# Patient Record
Sex: Female | Born: 1955 | Race: White | Hispanic: No | Smoking: Never smoker
Health system: Southern US, Community
[De-identification: ages and names within clinical notes are randomized; demographics above are authoritative.]

## PROBLEM LIST (undated history)

## (undated) DIAGNOSIS — K219 Gastro-esophageal reflux disease without esophagitis: Secondary | ICD-10-CM

## (undated) DIAGNOSIS — T753XXA Motion sickness, initial encounter: Secondary | ICD-10-CM

## (undated) DIAGNOSIS — C4490 Unspecified malignant neoplasm of skin, unspecified: Secondary | ICD-10-CM

## (undated) DIAGNOSIS — K08109 Complete loss of teeth, unspecified cause, unspecified class: Secondary | ICD-10-CM

## (undated) DIAGNOSIS — B589 Toxoplasmosis, unspecified: Secondary | ICD-10-CM

## (undated) DIAGNOSIS — I499 Cardiac arrhythmia, unspecified: Secondary | ICD-10-CM

## (undated) DIAGNOSIS — Z972 Presence of dental prosthetic device (complete) (partial): Secondary | ICD-10-CM

## (undated) DIAGNOSIS — I8390 Asymptomatic varicose veins of unspecified lower extremity: Secondary | ICD-10-CM

## (undated) DIAGNOSIS — I10 Essential (primary) hypertension: Secondary | ICD-10-CM

## (undated) DIAGNOSIS — R42 Dizziness and giddiness: Secondary | ICD-10-CM

## (undated) HISTORY — DX: Asymptomatic varicose veins of unspecified lower extremity: I83.90

## (undated) HISTORY — DX: Toxoplasmosis, unspecified: B58.9

## (undated) HISTORY — PX: EYE SURGERY: SHX253

## (undated) HISTORY — PX: CHOLECYSTECTOMY: SHX55

## (undated) HISTORY — PX: SKIN CANCER EXCISION: SHX779

## (undated) HISTORY — DX: Unspecified malignant neoplasm of skin, unspecified: C44.90

---

## 2004-06-03 ENCOUNTER — Ambulatory Visit: Payer: Self-pay | Admitting: Unknown Physician Specialty

## 2005-10-29 ENCOUNTER — Ambulatory Visit: Payer: Self-pay | Admitting: Family Medicine

## 2006-07-05 ENCOUNTER — Ambulatory Visit: Payer: Self-pay | Admitting: Obstetrics and Gynecology

## 2007-10-04 ENCOUNTER — Ambulatory Visit: Payer: Self-pay | Admitting: Ophthalmology

## 2008-01-19 HISTORY — PX: ABLATION SAPHENOUS VEIN W/ RFA: SUR11

## 2008-09-25 ENCOUNTER — Ambulatory Visit: Payer: Self-pay | Admitting: Family Medicine

## 2009-11-24 ENCOUNTER — Ambulatory Visit: Payer: Self-pay | Admitting: Unknown Physician Specialty

## 2009-11-26 ENCOUNTER — Ambulatory Visit: Payer: Self-pay | Admitting: Unknown Physician Specialty

## 2010-12-24 ENCOUNTER — Ambulatory Visit: Payer: Self-pay | Admitting: Obstetrics and Gynecology

## 2013-06-14 ENCOUNTER — Ambulatory Visit: Payer: Self-pay | Admitting: Nurse Practitioner

## 2014-06-27 ENCOUNTER — Other Ambulatory Visit: Payer: Self-pay

## 2014-06-28 ENCOUNTER — Other Ambulatory Visit: Payer: Self-pay

## 2014-06-28 DIAGNOSIS — Z1211 Encounter for screening for malignant neoplasm of colon: Secondary | ICD-10-CM

## 2014-06-28 NOTE — Progress Notes (Signed)
Pt screened via telephone call. Instructions/rx mailed.

## 2014-07-02 ENCOUNTER — Telehealth: Payer: Self-pay

## 2014-07-02 NOTE — Telephone Encounter (Signed)
Pt scheduled for a colonoscopy at [facility] on 08-26-14. Instructs/rx mailed.

## 2014-07-02 NOTE — Telephone Encounter (Signed)
-----   Message from Otilio Jefferson sent at 06/24/2014  9:25 AM EDT ----- Regarding: colon From: Tia Masker Sent: 06/06/2014  Phone Encounter: 06/07/2013  triage

## 2014-07-04 ENCOUNTER — Encounter: Payer: Self-pay | Admitting: *Deleted

## 2014-07-09 ENCOUNTER — Encounter: Payer: Self-pay | Admitting: General Surgery

## 2014-07-09 ENCOUNTER — Ambulatory Visit (INDEPENDENT_AMBULATORY_CARE_PROVIDER_SITE_OTHER): Payer: BLUE CROSS/BLUE SHIELD | Admitting: General Surgery

## 2014-07-09 VITALS — BP 100/64 | HR 80 | Resp 14 | Ht 64.0 in | Wt 206.0 lb

## 2014-07-09 DIAGNOSIS — I868 Varicose veins of other specified sites: Secondary | ICD-10-CM

## 2014-07-09 DIAGNOSIS — I8390 Asymptomatic varicose veins of unspecified lower extremity: Secondary | ICD-10-CM

## 2014-07-09 NOTE — Patient Instructions (Addendum)
Apply Zinc oxide or udder cream to lower legs. The patient is aware to call back for any questions or concerns. Wear compression hose- knee lengths are acceptable.   Follow up in 1 month to reassess, including venous duplex study.

## 2014-07-09 NOTE — Progress Notes (Signed)
Patient ID: Bridget Roach, female   DOB: [birth year], 59 y.o.   MRN: [id number]  Chief Complaint  Patient presents with  . Other    right leg nodule    HPI Bridget Roach is a 59 y.o. female.  Here today for evaluation of a right lower leg (near ankle) nodule with bleeding. She states she noticed some bleeding on Friday 07-05-14. She states there was no trauma that she recalls. It bled for about 5 minutes with pressure. This has never occurred before. History of right RF ablation 2010.   HPI  Past Medical History  Diagnosis Date  . Toxoplasmosis     eyes  . Skin cancer   . Varicose veins     Past Surgical History  Procedure Laterality Date  . Eye surgery    . Cholecystectomy    . Skin cancer excision    . Ablation saphenous vein w/ rfa Right 2010    Dr Jamal Collin    Family History  Problem Relation Age of Onset  . Stroke Father   . Cancer Mother     lung    Social History History  Substance Use Topics  . Smoking status: Never Smoker   . Smokeless tobacco: Never Used  . Alcohol Use: No    No Known Allergies  Current Outpatient Prescriptions  Medication Sig Dispense Refill  . atropine 1 % ophthalmic solution Place 1 drop into the right eye 2 (two) times daily.   0  . citalopram (CELEXA) 20 MG tablet Take 20 mg by mouth daily.   1  . levothyroxine (SYNTHROID, LEVOTHROID) 25 MCG tablet Take 25 mcg by mouth daily before breakfast.    . lisinopril-hydrochlorothiazide (PRINZIDE,ZESTORETIC) 20-25 MG per tablet Take 1 tablet by mouth daily.   1  . lovastatin (MEVACOR) 20 MG tablet Take 20 mg by mouth daily at 6 PM.   1  . metoprolol tartrate (LOPRESSOR) 25 MG tablet Take 25 mg by mouth 2 (two) times daily.   0  . omeprazole (PRILOSEC) 20 MG capsule Take 20 mg by mouth daily.   0  . Vitamin D, Ergocalciferol, (DRISDOL) 50000 UNITS CAPS capsule Take 50,000 Units by mouth every 7 (seven) days.   0   No current facility-administered medications for this visit.    Review  of Systems Review of Systems  Constitutional: Negative.   Respiratory: Negative.   Cardiovascular: Negative.     Blood pressure 100/64, pulse 80, resp. rate 14, height 5\' 4"  (1.626 m), weight 206 lb (93.441 kg).  Physical Exam Physical Exam  Constitutional: She is oriented to person, place, and time. She appears well-developed and well-nourished.  Cardiovascular:  Pulses:      Dorsalis pedis pulses are 2+ on the right side, and 2+ on the left side.       Posterior tibial pulses are 2+ on the right side, and 2+ on the left side.  Minimal swelling in bilateral lower legs. Some residual varicose veins bilaterally, not as prominent as they were at one time. Spider veins and some reticular veins above the ankle on both sides. The bleeding point appears sealed. Dry skin on bilateral lower legs.   Neurological: She is alert and oriented to person, place, and time.  Skin: Skin is warm and dry.    Data Reviewed Previous notes  Assessment    Varicose veins, previously treated with RF ablation on the right GSV. Had one episode spontaneous bleeder on the right leg above the ankle.  Plan    Apply Zinc oxide or udder cream to lower legs. The patient is aware to call back for any questions or concerns. Wear compression hose- knee lengths are acceptable.   Follow up in 1 month to reassess, including venous duplex study.      PCP:  Joselyn Glassman 07/09/2014, 6:18 PM

## 2014-08-07 ENCOUNTER — Encounter: Payer: Self-pay | Admitting: General Surgery

## 2014-08-07 ENCOUNTER — Ambulatory Visit (INDEPENDENT_AMBULATORY_CARE_PROVIDER_SITE_OTHER): Payer: BLUE CROSS/BLUE SHIELD | Admitting: General Surgery

## 2014-08-07 ENCOUNTER — Other Ambulatory Visit: Payer: BLUE CROSS/BLUE SHIELD

## 2014-08-07 VITALS — BP 104/64 | HR 68 | Resp 14 | Ht 64.0 in | Wt 206.0 lb

## 2014-08-07 DIAGNOSIS — I868 Varicose veins of other specified sites: Secondary | ICD-10-CM

## 2014-08-07 DIAGNOSIS — I8390 Asymptomatic varicose veins of unspecified lower extremity: Secondary | ICD-10-CM

## 2014-08-07 NOTE — Progress Notes (Signed)
She is here today for one month follow up varicose vein right lower ankle. No further episodes of bleeding. Her legs overall have "felt good". Only tylenol occasionally for the pain. No edema noted. She has a history of ABLATION right SAPHENOUS VEIN W/ RFA in 2010.  Duplex study-right GSV is sclerosed. Left GSV with markedly prolonged reflux. Both LSV area small and normal 2 perforator veins seen 7 and 10cm above right medial malleolus. No definite reflux noted.  Follow up in 3-4 months. Continue to wear compression hose.   PCP:  Sherrin Daisy

## 2014-08-07 NOTE — Patient Instructions (Addendum)
The patient is aware to call back for any questions or concerns. Continue to wear compression hose.

## 2014-08-16 ENCOUNTER — Encounter: Payer: Self-pay | Admitting: *Deleted

## 2014-08-22 NOTE — Discharge Instructions (Signed)

## 2014-08-26 ENCOUNTER — Encounter
Admission: RE | Disposition: A | Discharge status: Home / Self Care | Payer: Self-pay | Source: Ambulatory Visit | Attending: Gastroenterology

## 2014-08-26 ENCOUNTER — Ambulatory Visit
Admission: RE | Admit: 2014-08-26 | Discharge: 2014-08-26 | Disposition: A | Discharge status: Home / Self Care | Payer: BLUE CROSS/BLUE SHIELD | Source: Ambulatory Visit | Attending: Gastroenterology | Admitting: Gastroenterology

## 2014-08-26 ENCOUNTER — Ambulatory Visit: Payer: BLUE CROSS/BLUE SHIELD | Admitting: Anesthesiology

## 2014-08-26 DIAGNOSIS — Z85828 Personal history of other malignant neoplasm of skin: Secondary | ICD-10-CM | POA: Insufficient documentation

## 2014-08-26 DIAGNOSIS — I10 Essential (primary) hypertension: Secondary | ICD-10-CM | POA: Insufficient documentation

## 2014-08-26 DIAGNOSIS — K219 Gastro-esophageal reflux disease without esophagitis: Secondary | ICD-10-CM | POA: Diagnosis not present

## 2014-08-26 DIAGNOSIS — Z801 Family history of malignant neoplasm of trachea, bronchus and lung: Secondary | ICD-10-CM | POA: Insufficient documentation

## 2014-08-26 DIAGNOSIS — K640 First degree hemorrhoids: Secondary | ICD-10-CM | POA: Diagnosis not present

## 2014-08-26 DIAGNOSIS — Z79899 Other long term (current) drug therapy: Secondary | ICD-10-CM | POA: Diagnosis not present

## 2014-08-26 DIAGNOSIS — I499 Cardiac arrhythmia, unspecified: Secondary | ICD-10-CM | POA: Diagnosis not present

## 2014-08-26 DIAGNOSIS — Z9049 Acquired absence of other specified parts of digestive tract: Secondary | ICD-10-CM | POA: Diagnosis not present

## 2014-08-26 DIAGNOSIS — Z823 Family history of stroke: Secondary | ICD-10-CM | POA: Diagnosis not present

## 2014-08-26 DIAGNOSIS — Z1211 Encounter for screening for malignant neoplasm of colon: Secondary | ICD-10-CM | POA: Diagnosis not present

## 2014-08-26 DIAGNOSIS — I8390 Asymptomatic varicose veins of unspecified lower extremity: Secondary | ICD-10-CM | POA: Insufficient documentation

## 2014-08-26 HISTORY — DX: Presence of dental prosthetic device (complete) (partial): K08.109

## 2014-08-26 HISTORY — DX: Essential (primary) hypertension: I10

## 2014-08-26 HISTORY — DX: Cardiac arrhythmia, unspecified: I49.9

## 2014-08-26 HISTORY — DX: Motion sickness, initial encounter: T75.3XXA

## 2014-08-26 HISTORY — DX: Gastro-esophageal reflux disease without esophagitis: K21.9

## 2014-08-26 HISTORY — DX: Complete loss of teeth, unspecified cause, unspecified class: Z97.2

## 2014-08-26 HISTORY — PX: COLONOSCOPY WITH PROPOFOL: SHX5780

## 2014-08-26 HISTORY — DX: Dizziness and giddiness: R42

## 2014-08-26 SURGERY — COLONOSCOPY WITH PROPOFOL
Anesthesia: Monitor Anesthesia Care | Wound class: Contaminated

## 2014-08-26 MED ORDER — LIDOCAINE HCL (CARDIAC) 20 MG/ML IV SOLN
INTRAVENOUS | Status: DC | PRN
  Administered 2014-08-26: 50 mg via INTRAVENOUS

## 2014-08-26 MED ORDER — PROPOFOL 10 MG/ML IV BOLUS
INTRAVENOUS | Status: DC | PRN
  Administered 2014-08-26: 30 mg via INTRAVENOUS
  Administered 2014-08-26: 20 mg via INTRAVENOUS
  Administered 2014-08-26 (×2): 30 mg via INTRAVENOUS
  Administered 2014-08-26: 60 mg via INTRAVENOUS
  Administered 2014-08-26: 30 mg via INTRAVENOUS

## 2014-08-26 MED ORDER — ACETAMINOPHEN 160 MG/5ML PO SOLN: 325.0000 mg | ORAL | Status: DC | PRN

## 2014-08-26 MED ORDER — LACTATED RINGERS IV SOLN
INTRAVENOUS | Status: DC
  Administered 2014-08-26: 08:00:00 via INTRAVENOUS

## 2014-08-26 MED ORDER — STERILE WATER FOR IRRIGATION IR SOLN
Status: DC | PRN
  Administered 2014-08-26: 09:00:00

## 2014-08-26 MED ORDER — ACETAMINOPHEN 325 MG PO TABS
325.0000 mg | ORAL_TABLET | ORAL | Status: DC | PRN
Start: 2014-08-26 — End: 2014-08-26

## 2014-08-26 SURGICAL SUPPLY — 28 items

## 2014-08-26 NOTE — H&P (Signed)
[facility]  [address] [place], [place] [address] Phone: [phone] Fax : [phone]  Primary Care Physician:  Sherrin Daisy, MD Primary Gastroenterologist:  Dr. Allen Norris  Pre-Procedure History & Physical: HPI:  Bridget Roach is a 59 y.o. female is here for a screening colonoscopy.   Past Medical History  Diagnosis Date  . Toxoplasmosis     eyes  . Skin cancer   . Varicose veins   . Full dentures     upper and lower  . Hypertension   . Dysrhythmia   . GERD (gastroesophageal reflux disease)   . Vertigo     none in years  . Motion sickness     all moving vehicles    Past Surgical History  Procedure Laterality Date  . Eye surgery    . Cholecystectomy    . Skin cancer excision    . Ablation saphenous vein w/ rfa Right 2010    Dr Jamal Collin    Prior to Admission medications   Medication Sig Start Date End Date Taking? Authorizing Provider  citalopram (CELEXA) 20 MG tablet Take 20 mg by mouth daily.  07/01/14  Yes Historical Provider, MD  levothyroxine (SYNTHROID, LEVOTHROID) 25 MCG tablet Take 25 mcg by mouth daily before breakfast.   Yes Historical Provider, MD  lisinopril-hydrochlorothiazide (PRINZIDE,ZESTORETIC) 20-25 MG per tablet Take 1 tablet by mouth daily.  06/10/14  Yes Historical Provider, MD  lovastatin (MEVACOR) 20 MG tablet Take 20 mg by mouth daily at 6 PM.  07/01/14  Yes Historical Provider, MD  metoprolol tartrate (LOPRESSOR) 25 MG tablet Take 25 mg by mouth 2 (two) times daily.  06/05/14  Yes Historical Provider, MD  omeprazole (PRILOSEC) 20 MG capsule Take 20 mg by mouth daily.  06/24/14  Yes Historical Provider, MD  Vitamin D, Ergocalciferol, (DRISDOL) 50000 UNITS CAPS capsule Take 50,000 Units by mouth every 7 (seven) days.  07/01/14  Yes Historical Provider, MD  atropine 1 % ophthalmic solution Place 1 drop into the right eye 2 (two) times daily.  06/21/14   Historical Provider, MD    Allergies as of 06/28/2014  . (Not on File)     Family History  Problem Relation Age of Onset  . Stroke Father   . Cancer Mother     lung    History   Social History  . Marital Status: Married    Spouse Name: N/A  . Number of Children: N/A  . Years of Education: N/A   Occupational History  . Not on file.   Social History Main Topics  . Smoking status: Never Smoker   . Smokeless tobacco: Never Used  . Alcohol Use: No  . Drug Use: No  . Sexual Activity: Not on file   Other Topics Concern  . Not on file   Social History Narrative    Review of Systems: See HPI, otherwise negative ROS  Physical Exam: BP 129/82 mmHg  Pulse 79  Temp(Src) 97.5 F (36.4 C) (Temporal)  Resp 16  Ht 5\' 4"  (1.626 m)  Wt 201 lb (91.173 kg)  BMI 34.48 kg/m2  SpO2 97% General:   Alert,  pleasant and cooperative in NAD Head:  Normocephalic and atraumatic. Neck:  Supple; no masses or thyromegaly. Lungs:  Clear throughout to auscultation.    Heart:  Regular rate and rhythm. Abdomen:  Soft, nontender and nondistended. Normal bowel sounds, without guarding, and without rebound.   Neurologic:  Alert and  oriented x4;  grossly normal neurologically.  Impression/Plan: Bridget Roach is now here to undergo a screening colonoscopy.  Risks, benefits, and alternatives regarding colonoscopy have been reviewed with the patient.  Questions have been answered.  All parties agreeable.

## 2014-08-26 NOTE — Op Note (Signed)
[facility] Patient Name: Bridget Roach Procedure Date: 08/26/2014 8:27 AM MRN: [id number] Account #: [id number] Date of Birth: Sep 11, 1955 Admit Type: Outpatient Age: 59 Room: [facility] OR ROOM 01 Gender: Female Note Status: Finalized Procedure:         Colonoscopy Indications:       Screening for colorectal malignant neoplasm Providers:         Lucilla Lame, MD Referring MD:      Shirline Frees (Referring MD) Medicines:         Propofol per Anesthesia Complications:     No immediate complications. Procedure:         Pre-Anesthesia Assessment:                    - Prior to the procedure, a History and Physical was                     performed, and patient medications and allergies were                     reviewed. The patient's tolerance of previous anesthesia                     was also reviewed. The risks and benefits of the procedure                     and the sedation options and risks were discussed with the                     patient. All questions were answered, and informed consent                     was obtained. Prior Anticoagulants: The patient has taken                     no previous anticoagulant or antiplatelet agents. ASA                     Grade Assessment: II - A patient with mild systemic                     disease. After reviewing the risks and benefits, the                     patient was deemed in satisfactory condition to undergo                     the procedure.                    After obtaining informed consent, the colonoscope was                     passed under direct vision. Throughout the procedure, the                     patient's blood pressure, pulse, and oxygen saturations                     were monitored continuously. The Olympus CF-HQ190L                     Colonoscope (S#. [id number]) was introduced through the anus  and advanced to the the cecum, identified by appendiceal            orifice and ileocecal valve. The colonoscopy was performed                     without difficulty. The patient tolerated the procedure                     well. The quality of the bowel preparation was excellent. Findings:      The perianal and digital rectal examinations were normal.      Non-bleeding internal hemorrhoids were found during retroflexion. The       hemorrhoids were Grade I (internal hemorrhoids that do not prolapse). Impression:        - Non-bleeding internal hemorrhoids.                    - No specimens collected. Recommendation:    - Repeat colonoscopy in 10 years for screening unless any                     change in family history or lower GI problems. Procedure Code(s): --- Professional ---                    [phone], Colonoscopy, flexible; diagnostic, including                     collection of specimen(s) by brushing or washing, when                     performed (separate procedure) Diagnosis Code(s): --- Professional ---                    Z12.11, Encounter for screening for malignant neoplasm of                     colon                    K64.0, First degree hemorrhoids CPT copyright 2014 American Medical Association. All rights reserved. The codes documented in this report are preliminary and upon coder review may  be revised to meet current compliance requirements. Lucilla Lame, MD 08/26/2014 8:47:07 AM This report has been signed electronically. Number of Addenda: 0 Note Initiated On: 08/26/2014 8:27 AM Scope Withdrawal Time: 0 hours 6 minutes 13 seconds  Total Procedure Duration: 0 hours 9 minutes 42 seconds       [facility]

## 2014-08-26 NOTE — Transfer of Care (Signed)
Immediate Anesthesia Transfer of Care Note  Patient: Bridget Roach  Procedure(s) Performed: Procedure(s): COLONOSCOPY WITH PROPOFOL (N/A)  Patient Location: PACU  Anesthesia Type: MAC  Level of Consciousness: awake, alert  and patient cooperative  Airway and Oxygen Therapy: Patient Spontanous Breathing and Patient connected to supplemental oxygen  Post-op Assessment: Post-op Vital signs reviewed, Patient's Cardiovascular Status Stable, Respiratory Function Stable, Patent Airway and No signs of Nausea or vomiting  Post-op Vital Signs: Reviewed and stable  Complications: No apparent anesthesia complications

## 2014-08-26 NOTE — Anesthesia Postprocedure Evaluation (Signed)
  Anesthesia Post-op Note  Patient: Bridget Roach  Procedure(s) Performed: Procedure(s): COLONOSCOPY WITH PROPOFOL (N/A)  Anesthesia type:MAC  Patient location: PACU  Post pain: Pain level controlled  Post assessment: Post-op Vital signs reviewed, Patient's Cardiovascular Status Stable, Respiratory Function Stable, Patent Airway and No signs of Nausea or vomiting  Post vital signs: Reviewed and stable  Last Vitals:  Filed Vitals:   08/26/14 0849  BP:   Pulse:   Temp: 36.3 C  Resp:     Level of consciousness: awake, alert  and patient cooperative  Complications: No apparent anesthesia complications

## 2014-08-26 NOTE — Anesthesia Preprocedure Evaluation (Signed)
Anesthesia Evaluation  Patient identified by MRN, date of birth, ID band  Reviewed: Allergy & Precautions, H&P , NPO status , Patient's Chart, lab work & pertinent test results  Airway Mallampati: III  TM Distance: >3 FB Neck ROM: full    Dental  (+) Upper Dentures, Lower Dentures   Pulmonary    Pulmonary exam normal       Cardiovascular hypertension, Rhythm:regular Rate:Normal     Neuro/Psych    GI/Hepatic GERD-  ,  Endo/Other    Renal/GU      Musculoskeletal   Abdominal   Peds  Hematology   Anesthesia Other Findings   Reproductive/Obstetrics                             Anesthesia Physical Anesthesia Plan  ASA: II  Anesthesia Plan: MAC   Post-op Pain Management:    Induction:   Airway Management Planned:   Additional Equipment:   Intra-op Plan:   Post-operative Plan:   Informed Consent: I have reviewed the patients History and Physical, chart, labs and discussed the procedure including the risks, benefits and alternatives for the proposed anesthesia with the patient or authorized representative who has indicated his/her understanding and acceptance.     Plan Discussed with: CRNA  Anesthesia Plan Comments:         Anesthesia Quick Evaluation

## 2014-08-27 ENCOUNTER — Encounter: Payer: Self-pay | Admitting: Gastroenterology

## 2014-11-07 ENCOUNTER — Encounter: Payer: Self-pay | Admitting: General Surgery

## 2014-11-07 ENCOUNTER — Ambulatory Visit (INDEPENDENT_AMBULATORY_CARE_PROVIDER_SITE_OTHER): Payer: BLUE CROSS/BLUE SHIELD | Admitting: General Surgery

## 2014-11-07 VITALS — BP 118/72 | HR 74 | Resp 12 | Ht 64.0 in | Wt 212.0 lb

## 2014-11-07 DIAGNOSIS — I868 Varicose veins of other specified sites: Secondary | ICD-10-CM

## 2014-11-07 DIAGNOSIS — I8390 Asymptomatic varicose veins of unspecified lower extremity: Secondary | ICD-10-CM

## 2014-11-07 NOTE — Progress Notes (Signed)
She is here today for one month follow up varicose vein right lower ankle. No further episodes of bleeding. Her legs overall have "felt good". Only tylenol occasionally for the pain. No edema noted. She has a history of ABLATION right SAPHENOUS VEIN W/ RFA in 2010.  No edema, minimal varicose veins noted in b/l calves. Pedal pulses strong b/l. No stasis changes noted.   Patient requesting cosmetic treatment for spider veins b/l at a later date. Will set up appointment when ready.   Continue using compression hose and tylenol prn, follow up in one year unless having any issues sooner.

## 2014-11-07 NOTE — Patient Instructions (Signed)
The patient is aware to call back for any questions or concerns.  

## 2015-03-04 ENCOUNTER — Ambulatory Visit
Admission: EM | Admit: 2015-03-04 | Discharge: 2015-03-04 | Disposition: A | Discharge status: Home / Self Care | Payer: BLUE CROSS/BLUE SHIELD | Attending: Family Medicine | Admitting: Family Medicine

## 2015-03-04 DIAGNOSIS — S76311A Strain of muscle, fascia and tendon of the posterior muscle group at thigh level, right thigh, initial encounter: Secondary | ICD-10-CM

## 2015-03-04 DIAGNOSIS — S76011A Strain of muscle, fascia and tendon of right hip, initial encounter: Secondary | ICD-10-CM

## 2015-03-04 MED ORDER — NAPROXEN 500 MG PO TABS: 500.0000 mg | ORAL_TABLET | Freq: Two times a day (BID) | ORAL | Status: AC

## 2015-03-04 NOTE — Discharge Instructions (Signed)

## 2015-03-04 NOTE — ED Provider Notes (Signed)
CSN: [id number]     Arrival date & time 03/04/15  1627 History   First MD Initiated Contact with Patient 03/04/15 1710     Chief Complaint  Patient presents with  . Back Pain    Lower Back Pain  . Hip Pain    Right Hip Pain   (Consider location/radiation/quality/duration/timing/severity/associated sxs/prior Treatment) HPI   60 year old female who presents with right hip pain she indicates in her lower back radiating laterally and slightly inferiorly. About a week ago. She states that she was at the beach and stepped into a hole which jarred her and she began to have significant pain shortly afterwards. She states that she actually had to leave early to return home because the pain. Now she states that certain motions are particularly bothersome to her. She is not having any difficulty with her gait. She related while doing laundry yesterday she was unable to carry the basket of clothing , she tied a loop with a  belt through one of the hamper holes and dragged  It across the floor for comfortably. She denies any radiation of the pain into her leg or foot. She has no numbness or tingling and denies any incontinence.  Past Medical History  Diagnosis Date  . Toxoplasmosis     eyes  . Skin cancer   . Varicose veins   . Full dentures     upper and lower  . Hypertension   . Dysrhythmia   . GERD (gastroesophageal reflux disease)   . Vertigo     none in years  . Motion sickness     all moving vehicles   Past Surgical History  Procedure Laterality Date  . Eye surgery    . Cholecystectomy    . Skin cancer excision    . Ablation saphenous vein w/ rfa Right 2010    Dr Jamal Collin  . Colonoscopy with propofol N/A 08/26/2014    Procedure: COLONOSCOPY WITH PROPOFOL;  Surgeon: Lucilla Lame, MD;  Location: [place];  Service: Endoscopy;  Laterality: N/A;   Family History  Problem Relation Age of Onset  . Stroke Father   . Cancer Mother     lung   Social History  Substance Use Topics   . Smoking status: Never Smoker   . Smokeless tobacco: Never Used  . Alcohol Use: No   OB History    Gravida Para Term Preterm AB TAB SAB Ectopic Multiple Living   2 2              Obstetric Comments   1st Menstrual Cycle:  12  1st Pregnancy:  17     Review of Systems  Constitutional: Positive for activity change. Negative for fever, chills and fatigue.  Musculoskeletal: Positive for myalgias and back pain.  All other systems reviewed and are negative.   Allergies  Review of patient's allergies indicates no known allergies.  Home Medications   Prior to Admission medications   Medication Sig Start Date End Date Taking? Authorizing Provider  Calcium Carbonate (CALCIUM 600 PO) Take 1 tablet by mouth daily.   Yes Historical Provider, MD  citalopram (CELEXA) 20 MG tablet Take 20 mg by mouth daily.  07/01/14  Yes Historical Provider, MD  levothyroxine (SYNTHROID, LEVOTHROID) 25 MCG tablet Take 25 mcg by mouth daily before breakfast.   Yes Historical Provider, MD  lisinopril-hydrochlorothiazide (PRINZIDE,ZESTORETIC) 20-25 MG per tablet Take 1 tablet by mouth daily.  06/10/14  Yes Historical Provider, MD  lovastatin (MEVACOR) 20 MG tablet  Take 20 mg by mouth daily at 6 PM.  07/01/14  Yes Historical Provider, MD  metoprolol tartrate (LOPRESSOR) 25 MG tablet Take 25 mg by mouth 2 (two) times daily.  06/05/14  Yes Historical Provider, MD  omeprazole (PRILOSEC) 20 MG capsule Take 20 mg by mouth daily.  06/24/14  Yes Historical Provider, MD  Vitamin D, Ergocalciferol, (DRISDOL) 50000 UNITS CAPS capsule Take 50,000 Units by mouth every 7 (seven) days.  07/01/14  Yes Historical Provider, MD  atropine 1 % ophthalmic solution Place 1 drop into the right eye 2 (two) times daily.  06/21/14   Historical Provider, MD  naproxen (NAPROSYN) 500 MG tablet Take 1 tablet (500 mg total) by mouth 2 (two) times daily with a meal. 03/04/15   Lorin Picket, PA-C   Meds Ordered and Administered this Visit   Medications - No data to display  BP 116/57 mmHg  Pulse 74  Temp(Src) 98.4 F (36.9 C) (Oral)  Resp 17  Ht 5\' 4"  (1.626 m)  Wt 215 lb (97.523 kg)  BMI 36.89 kg/m2  SpO2 99% No data found.   Physical Exam  Constitutional: She is oriented to person, place, and time. She appears well-developed and well-nourished. No distress.  HENT:  Head: Normocephalic and atraumatic.  Eyes: Conjunctivae are normal. Pupils are equal, round, and reactive to light.  Neck: Normal range of motion. Neck supple.  Musculoskeletal: She exhibits tenderness. She exhibits no edema.  Examination of her back was performed in the presence of Heather, RN acting as a chaperone. The patient is a level pelvis in stance. For flexion and extension are full and not painful. Lateral flexion however is more painful particularly to the right. Resting rotation up acutely to the right is also painful. Able to toe and heel walk without difficulty. DTRs are 2+ and over 4 symmetrical laterally. Semination with the patient supine shows tenderness that reproduces her symptoms over the gluteus maximus and deeply into the gluteus medius which is the most painful. Hip Range of motion is full and comfortable.  Neurological: She is alert and oriented to person, place, and time.  Skin: Skin is warm. She is not diaphoretic.  Psychiatric: She has a normal mood and affect. Her behavior is normal. Judgment and thought content normal.  Nursing note and vitals reviewed.   ED Course  Procedures (including critical care time)  Labs Review Labs Reviewed - No data to display  Imaging Review No results found.   Visual Acuity Review  Right Eye Distance:   Left Eye Distance:   Bilateral Distance:    Right Eye Near:   Left Eye Near:    Bilateral Near:         MDM   1. Strain of gluteus medius of right lower extremity, initial encounter    Discharge Medication List as of 03/04/2015  5:57 PM    START taking these medications    Details  naproxen (NAPROSYN) 500 MG tablet Take 1 tablet (500 mg total) by mouth 2 (two) times daily with a meal., Starting 03/04/2015, Until Discontinued, Normal      Plan: 1. Diagnosis reviewed with patient 2. rx as per orders; risks, benefits, potential side effects reviewed with patient 3. Recommend supportive treatment with with symptom avoidance. Should consider heat alternating with ice as necessary. Increase activities as tolerated. She continues to have problems she should be seen by her primary care physician for further evaluation 4. F/u prn if symptoms worsen or don't improve  Lorin Picket, PA-C 03/04/15 1814

## 2015-03-04 NOTE — ED Notes (Signed)
Patient c/o right hip pain and lower back pain which started 1 week ago.  She states that she was at the beach last week and stepped into a gully and jarred her back.

## 2015-03-11 ENCOUNTER — Ambulatory Visit (INDEPENDENT_AMBULATORY_CARE_PROVIDER_SITE_OTHER): Payer: BLUE CROSS/BLUE SHIELD

## 2015-03-11 ENCOUNTER — Ambulatory Visit
Admission: EM | Admit: 2015-03-11 | Discharge: 2015-03-11 | Disposition: A | Discharge status: Home / Self Care | Payer: BLUE CROSS/BLUE SHIELD | Attending: Family Medicine | Admitting: Family Medicine

## 2015-03-11 DIAGNOSIS — S93402A Sprain of unspecified ligament of left ankle, initial encounter: Secondary | ICD-10-CM

## 2015-03-11 NOTE — Discharge Instructions (Signed)
Ankle Sprain °An ankle sprain is an injury to the strong, fibrous tissues (ligaments) that hold your ankle bones together.  °HOME CARE  °· Put ice on your ankle for 1-2 days or as told by your doctor. °· Put ice in a plastic bag. °· Place a towel between your skin and the bag. °· Leave the ice on for 15-20 minutes at a time, every 2 hours while you are awake. °· Only take medicine as told by your doctor. °· Raise (elevate) your injured ankle above the level of your heart as much as possible for 2-3 days. °· Use crutches if your doctor tells you to. Slowly put your own weight on the affected ankle. Use the crutches until you can walk without pain. °· If you have a plaster splint: °· Do not rest it on anything harder than a pillow for 24 hours. °· Do not put weight on it. °· Do not get it wet. °· Take it off to shower or bathe. °· If given, use an elastic wrap or support stocking for support. Take the wrap off if your toes lose feeling (numb), tingle, or turn cold or blue. °· If you have an air splint: °· Add or let out air to make it comfortable. °· Take it off at night and to shower and bathe. °· Wiggle your toes and move your ankle up and down often while you are wearing it. °GET HELP IF: °· You have rapidly increasing bruising or puffiness (swelling). °· Your toes feel very cold. °· You lose feeling in your foot. °· Your medicine does not help your pain. °GET HELP RIGHT AWAY IF:  °· Your toes lose feeling (numb) or turn blue. °· You have severe pain that is increasing. °MAKE SURE YOU:  °· Understand these instructions. °· Will watch your condition. °· Will get help right away if you are not doing well or get worse. °  °This information is not intended to replace advice given to you by your health care provider. Make sure you discuss any questions you have with your health care provider. °  °Document Released: 06/23/2007 Document Revised: 01/25/2014 Document Reviewed: 07/19/2011 °Elsevier Interactive Patient  Education ©2016 [employer] ° °Cryotherapy °Cryotherapy is when you put ice on your injury. Ice helps lessen pain and puffiness (swelling) after an injury. Ice works the best when you start using it in the first 24 to 48 hours after an injury. °HOME CARE °· Put a dry or damp towel between the ice pack and your skin. °· You may press gently on the ice pack. °· Leave the ice on for no more than 10 to 20 minutes at a time. °· Check your skin after 5 minutes to make sure your skin is okay. °· Rest at least 20 minutes between ice pack uses. °· Stop using ice when your skin loses feeling (numbness). °· Do not use ice on someone who cannot tell you when it hurts. This includes small children and people with memory problems (dementia). °GET HELP RIGHT AWAY IF: °· You have white spots on your skin. °· Your skin turns blue or pale. °· Your skin feels waxy or hard. °· Your puffiness gets worse. °MAKE SURE YOU:  °· Understand these instructions. °· Will watch your condition. °· Will get help right away if you are not doing well or get worse. °  °This information is not intended to replace advice given to you by your health care provider. Make sure you discuss any   questions you have with your health care provider.   Document Released: 06/23/2007 Document Revised: 03/29/2011 Document Reviewed: 08/27/2010 Elsevier Interactive Patient Education 2016 Else[employer] Inc[employer]  Stirrup Ankle Brace Stirrup ankle braces give support and help stabilize the ankle joint. They are rigid pieces of plastic or fiberglass that go up both sides of the lower leg with the bottom of the stirrup fitting comfortably under the bottom of the instep of the foot. It can be held on with Velcro straps or an elastic wrap. Stirrup ankle braces are used to support the ankle following mild or moderate sprains or strains, or fractures after cast removal.  They can be easily removed or adjusted if there is swelling. The rigid brace shells are designed to fit the  ankle comfortably and provide the needed medial/lateral stabilization. This brace can be easily worn with most athletic shoes. The brace liner is usually made of a soft, comfortable gel-like material. This gel fits the ankle well without causing uncomfortable pressure points.  IMPORTANCE OF ANKLE BRACES:  The use of ankle bracing is effective in the prevention of ankle sprains.  In athletes, the use of ankle bracing will offer protection and prevent further sprains.  Research shows that a complete rehabilitation program needs to be included with external bracing. This includes range of motion and ankle strengthening exercises. Your caregivers will instruct you in this. If you were given the brace today for a new injury, use the following home care instructions as a guide. HOME CARE INSTRUCTIONS   Apply ice to the sore area for 15-20 minutes, 03-04 times per day while awake for the first 2 days. Put the ice in a plastic bag and place a towel between the bag of ice and your skin. Never place the ice pack directly on your skin. Be especially careful using ice on an elbow or knee or other bony area, such as your ankle, because icing for too long may damage the nerves which are close to the surface.  Keep your leg elevated when possible to lessen swelling.  Wear your splint until you are seen for a follow-up examination. Do not put weight on it. Do not get it wet. You may take it off to take a shower or bath.  For Activity: Use crutches with non-weight bearing for 1 week. Then, you may walk on your ankle as instructed. Start gradually with weight bearing on the affected ankle.  Continue to use crutches or a cane until you can stand on your ankle without causing pain.  Wiggle your toes in the splint several times per day if you are able.  The splint is too tight if you have numbness, tingling, or if your foot becomes cold and blue. Adjust the straps or elastic bandage to make it comfortable.  Only  take over-the-counter or prescription medicines for pain, discomfort, or fever as directed by your caregiver. SEEK IMMEDIATE MEDICAL CARE IF:   You have increased bruising, swelling or pain.  Your toes are blue or cold and loosening the brace or wrap does not help.  Your pain is not relieved with medicine. MAKE SURE YOU:   Understand these instructions.  Will watch your condition.  Will get help right away if you are not doing well or get worse.   This information is not intended to replace advice given to you by your health care provider. Make sure you discuss any questions you have with your health care provider.   Document Released: 11/05/2003 Document Revised: 03/29/2011  Document Reviewed: 08/06/2014 Elsevier Interactive Patient Education [employer].

## 2015-03-11 NOTE — ED Notes (Addendum)
Patient states that she turned her left ankle and fell off the curb of the sidewalk this past Sunday.  She states that her left foot is where she is having pain however.  Patient was here last week as well.

## 2015-03-11 NOTE — ED Provider Notes (Signed)
CSN: [id number]     Arrival date & time 03/11/15  1423 History   First MD Initiated Contact with Patient 03/11/15 1617    Nurses notes were reviewed. Chief Complaint  Patient presents with  . Foot Pain    Left Foot Pain    Patient report Sunday twisting her left ankle injuring it. States that the ankle still hurts and that her friends and families concerns she came in to be seen and evaluated. Patient multiple issues multiple medical problems including hypertension skin cancers or surgery cholecystectomy. Father had a stroke mother had cancer of the lung. She states she never smoked. (Consider location/radiation/quality/duration/timing/severity/associated sxs/prior Treatment) HPI  Past Medical History  Diagnosis Date  . Toxoplasmosis     eyes  . Skin cancer   . Varicose veins   . Full dentures     upper and lower  . Hypertension   . Dysrhythmia   . GERD (gastroesophageal reflux disease)   . Vertigo     none in years  . Motion sickness     all moving vehicles   Past Surgical History  Procedure Laterality Date  . Eye surgery    . Cholecystectomy    . Skin cancer excision    . Ablation saphenous vein w/ rfa Right 2010    Dr Jamal Collin  . Colonoscopy with propofol N/A 08/26/2014    Procedure: COLONOSCOPY WITH PROPOFOL;  Surgeon: Lucilla Lame, MD;  Location: [facility];  Service: Endoscopy;  Laterality: N/A;   Family History  Problem Relation Age of Onset  . Stroke Father   . Cancer Mother     lung   Social History  Substance Use Topics  . Smoking status: Never Smoker   . Smokeless tobacco: Never Used  . Alcohol Use: No   OB History    Gravida Para Term Preterm AB TAB SAB Ectopic Multiple Living   2 2              Obstetric Comments   1st Menstrual Cycle:  12  1st Pregnancy:  17     Review of Systems  All other systems reviewed and are negative.   Allergies  Review of patient's allergies indicates no known allergies.  Home Medications   Prior to  Admission medications   Medication Sig Start Date End Date Taking? Authorizing Provider  Calcium Carbonate (CALCIUM 600 PO) Take 1 tablet by mouth daily.   Yes Historical Provider, MD  citalopram (CELEXA) 20 MG tablet Take 20 mg by mouth daily.  07/01/14  Yes Historical Provider, MD  levothyroxine (SYNTHROID, LEVOTHROID) 25 MCG tablet Take 25 mcg by mouth daily before breakfast.   Yes Historical Provider, MD  lisinopril-hydrochlorothiazide (PRINZIDE,ZESTORETIC) 20-25 MG per tablet Take 1 tablet by mouth daily.  06/10/14  Yes Historical Provider, MD  lovastatin (MEVACOR) 20 MG tablet Take 20 mg by mouth daily at 6 PM.  07/01/14  Yes Historical Provider, MD  metoprolol tartrate (LOPRESSOR) 25 MG tablet Take 25 mg by mouth 2 (two) times daily.  06/05/14  Yes Historical Provider, MD  naproxen (NAPROSYN) 500 MG tablet Take 1 tablet (500 mg total) by mouth 2 (two) times daily with a meal. 03/04/15  Yes Lorin Picket, PA-C  omeprazole (PRILOSEC) 20 MG capsule Take 20 mg by mouth daily.  06/24/14  Yes Historical Provider, MD  Vitamin D, Ergocalciferol, (DRISDOL) 50000 UNITS CAPS capsule Take 50,000 Units by mouth every 7 (seven) days.  07/01/14  Yes Historical Provider, MD  atropine 1 %  ophthalmic solution Place 1 drop into the right eye 2 (two) times daily.  06/21/14   Historical Provider, MD   Meds Ordered and Administered this Visit  Medications - No data to display  BP 111/58 mmHg  Pulse 64  Temp(Src) 98 F (36.7 C) (Oral)  Resp 16  Ht 5\' 4"  (1.626 m)  Wt 214 lb (97.07 kg)  BMI 36.72 kg/m2  SpO2 100% No data found.   Physical Exam  Constitutional: She is oriented to person, place, and time. She appears well-developed and well-nourished.  HENT:  Head: Normocephalic and atraumatic.  Musculoskeletal: She exhibits tenderness.       Left ankle: Tenderness. Lateral malleolus tenderness found.       Feet:  Patient prone had an inversion injury with tenderness over the lateral malleolus of left  ankle  Neurological: She is alert and oriented to person, place, and time.  Skin: Skin is warm. No rash noted. There is erythema. No pallor.  Psychiatric: She has a normal mood and affect.    ED Course  Procedures (including critical care time)  Labs Review Labs Reviewed - No data to display  Imaging Review Dg Ankle Complete Left  03/11/2015  CLINICAL DATA:  Twisted ankle walking on sidewalk Sunday, lateral pain EXAM: LEFT ANKLE COMPLETE - 3+ VIEW COMPARISON:  None. FINDINGS: Three views of the left ankle submitted. No acute fracture or subluxation. Diffuse mild soft tissue swelling around the ankle. Tiny plantar and posterior spur of calcaneus. Ankle mortise is preserved. IMPRESSION: No acute fracture or subluxation. Diffuse mild soft tissue swelling. Small plantar and posterior spur of calcaneus. Electronically Signed   By: Lahoma Crocker M.D.   On: 03/11/2015 16:35   Dg Foot Complete Left  03/11/2015  CLINICAL DATA:  Twisted ankle, pain laterally EXAM: LEFT FOOT - COMPLETE 3+ VIEW COMPARISON:  None. FINDINGS: Tarsal-metatarsal alignment is normal. No fracture is seen. No erosion is noted. IMPRESSION: Negative. Electronically Signed   By: Ivar Drape M.D.   On: 03/11/2015 16:26     Visual Acuity Review  Right Eye Distance:   Left Eye Distance:   Bilateral Distance:    Right Eye Near:   Left Eye Near:    Bilateral Near:         MDM   1. Left ankle sprain, initial encounter    We'll place patient in a left ankle splint return to follow-up PCP as needed.   Note: This dictation was prepared with Dragon dictation along with smaller phrase technology. Any transcriptional errors that result from this process are unintentional.    Frederich Cha, MD 03/11/15 1735

## 2015-07-15 ENCOUNTER — Ambulatory Visit
Admission: EM | Admit: 2015-07-15 | Discharge: 2015-07-15 | Disposition: A | Discharge status: Home / Self Care | Payer: BLUE CROSS/BLUE SHIELD | Attending: Family Medicine | Admitting: Family Medicine

## 2015-07-15 ENCOUNTER — Encounter: Payer: Self-pay | Admitting: Emergency Medicine

## 2015-07-15 DIAGNOSIS — S81812A Laceration without foreign body, left lower leg, initial encounter: Secondary | ICD-10-CM | POA: Diagnosis not present

## 2015-07-15 NOTE — ED Provider Notes (Signed)
CSN: [id number]     Arrival date & time 07/15/15  1522 History   First MD Initiated Contact with Patient 07/15/15 1551     Chief Complaint  Patient presents with  . Laceration   (Consider location/radiation/quality/duration/timing/severity/associated sxs/prior Treatment) HPI  This 60 year old female presents with a small nick that occurred when she was shaving her legs in the shower today. She states that the bleeding was severe at first and "squirting". She is using a brand new razor. She is current on her tetanus toxoid. Bleeding was controlled by nursing staff here at the clinic.      Past Medical History  Diagnosis Date  . Toxoplasmosis     eyes  . Skin cancer   . Varicose veins   . Full dentures     upper and lower  . Hypertension   . Dysrhythmia   . GERD (gastroesophageal reflux disease)   . Vertigo     none in years  . Motion sickness     all moving vehicles   Past Surgical History  Procedure Laterality Date  . Eye surgery    . Cholecystectomy    . Skin cancer excision    . Ablation saphenous vein w/ rfa Right 2010    Dr Jamal Collin  . Colonoscopy with propofol N/A 08/26/2014    Procedure: COLONOSCOPY WITH PROPOFOL;  Surgeon: Lucilla Lame, MD;  Location: [facility];  Service: Endoscopy;  Laterality: N/A;   Family History  Problem Relation Age of Onset  . Stroke Father   . Cancer Mother     lung   Social History  Substance Use Topics  . Smoking status: Never Smoker   . Smokeless tobacco: Never Used  . Alcohol Use: No   OB History    Gravida Para Term Preterm AB TAB SAB Ectopic Multiple Living   2 2              Obstetric Comments   1st Menstrual Cycle:  12  1st Pregnancy:  17     Review of Systems  Constitutional: Negative for fever, chills, activity change and fatigue.  Skin: Positive for wound.  All other systems reviewed and are negative.   Allergies  Review of patient's allergies indicates no known allergies.  Home Medications    Prior to Admission medications   Medication Sig Start Date End Date Taking? Authorizing Provider  atropine 1 % ophthalmic solution Place 1 drop into the right eye 2 (two) times daily.  06/21/14   Historical Provider, MD  Calcium Carbonate (CALCIUM 600 PO) Take 1 tablet by mouth daily.    Historical Provider, MD  citalopram (CELEXA) 20 MG tablet Take 20 mg by mouth daily.  07/01/14   Historical Provider, MD  levothyroxine (SYNTHROID, LEVOTHROID) 25 MCG tablet Take 25 mcg by mouth daily before breakfast.    Historical Provider, MD  lisinopril-hydrochlorothiazide (PRINZIDE,ZESTORETIC) 20-25 MG per tablet Take 1 tablet by mouth daily.  06/10/14   Historical Provider, MD  lovastatin (MEVACOR) 20 MG tablet Take 20 mg by mouth daily at 6 PM.  07/01/14   Historical Provider, MD  metoprolol tartrate (LOPRESSOR) 25 MG tablet Take 25 mg by mouth 2 (two) times daily.  06/05/14   Historical Provider, MD  naproxen (NAPROSYN) 500 MG tablet Take 1 tablet (500 mg total) by mouth 2 (two) times daily with a meal. 03/04/15   Lorin Picket, PA-C  omeprazole (PRILOSEC) 20 MG capsule Take 20 mg by mouth daily.  06/24/14   Historical  Provider, MD  Vitamin D, Ergocalciferol, (DRISDOL) 50000 UNITS CAPS capsule Take 50,000 Units by mouth every 7 (seven) days.  07/01/14   Historical Provider, MD   Meds Ordered and Administered this Visit  Medications - No data to display  BP 131/55 mmHg  Pulse 73  Temp(Src) 98.1 F (36.7 C) (Oral)  Resp 18  SpO2 96% No data found.   Physical Exam  Constitutional: She is oriented to person, place, and time. She appears well-developed and well-nourished. No distress.  HENT:  Head: Normocephalic and atraumatic.  Eyes: Conjunctivae are normal. Pupils are equal, round, and reactive to light.  Neck: Normal range of motion. Neck supple.  Musculoskeletal: Normal range of motion. She exhibits tenderness. She exhibits no edema.  Neurological: She is alert and oriented to person, place, and  time.  Skin: Skin is warm and dry. She is not diaphoretic.  Examination of the distal left leg anteriorly shows a small pinpoint laceration over a prominent transverse vein is very superficial. Bleeding is controlled. There is some mild tenderness around the area.  Psychiatric: She has a normal mood and affect. Her behavior is normal. Judgment and thought content normal.  Nursing note and vitals reviewed.   ED Course  Procedures (including critical care time)  Labs Review Labs Reviewed - No data to display  Imaging Review No results found.   Visual Acuity Review  Right Eye Distance:   Left Eye Distance:   Bilateral Distance:    Right Eye Near:   Left Eye Near:    Bilateral Near:     Procedure; discussed with the patient the need for patient for signs and symptoms of infection. I have decided to apply Dermabond glue to the area for added protection until healing fully takes place. There is no need for suturing. This is performed without complication. And tolerated the procedure well.    MDM   1. Laceration of left leg, initial encounter    New Prescriptions   No medications on file  Plan: 1. Test/x-ray results and diagnosis reviewed with patient 2. rx as per orders; risks, benefits, potential side effects reviewed with patient 3. Recommend supportive treatment with Observation for signs of possible infection instructions were given to the patient as well as printed material. 4. F/u prn if symptoms worsen or don't improve     Lorin Picket, PA-C 07/15/15 1705

## 2015-07-15 NOTE — Discharge Instructions (Signed)
Laceration Care, Adult A laceration is a cut that goes through all of the layers of the skin and into the tissue that is right under the skin. Some lacerations heal on their own. Others need to be closed with stitches (sutures), staples, skin adhesive strips, or skin glue. Proper laceration care minimizes the risk of infection and helps the laceration to heal better. HOW TO CARE FOR YOUR LACERATION If sutures or staples were used:  Keep the wound clean and dry.  If you were given a bandage (dressing), you should change it at least one time per day or as told by your health care provider. You should also change it if it becomes wet or dirty.  Keep the wound completely dry for the first 24 hours or as told by your health care provider. After that time, you may shower or bathe. However, make sure that the wound is not soaked in water until after the sutures or staples have been removed.  Clean the wound one time each day or as told by your health care provider:  Wash the wound with soap and water.  Rinse the wound with water to remove all soap.  Pat the wound dry with a clean towel. Do not rub the wound.  After cleaning the wound, apply a thin layer of antibiotic ointmentas told by your health care provider. This will help to prevent infection and keep the dressing from sticking to the wound.  Have the sutures or staples removed as told by your health care provider. If skin adhesive strips were used:  Keep the wound clean and dry.  If you were given a bandage (dressing), you should change it at least one time per day or as told by your health care provider. You should also change it if it becomes dirty or wet.  Do not get the skin adhesive strips wet. You may shower or bathe, but be careful to keep the wound dry.  If the wound gets wet, pat it dry with a clean towel. Do not rub the wound.  Skin adhesive strips fall off on their own. You may trim the strips as the wound heals. Do not  remove skin adhesive strips that are still stuck to the wound. They will fall off in time. If skin glue was used:  Try to keep the wound dry, but you may briefly wet it in the shower or bath. Do not soak the wound in water, such as by swimming.  After you have showered or bathed, gently pat the wound dry with a clean towel. Do not rub the wound.  Do not do any activities that will make you sweat heavily until the skin glue has fallen off on its own.  Do not apply liquid, cream, or ointment medicine to the wound while the skin glue is in place. Using those may loosen the film before the wound has healed.  If you were given a bandage (dressing), you should change it at least one time per day or as told by your health care provider. You should also change it if it becomes dirty or wet.  If a dressing is placed over the wound, be careful not to apply tape directly over the skin glue. Doing that may cause the glue to be pulled off before the wound has healed.  Do not pick at the glue. The skin glue usually remains in place for 5-10 days, then it falls off of the skin. General Instructions  Take over-the-counter and prescription  medicines only as told by your health care provider.  If you were prescribed an antibiotic medicine or ointment, take or apply it as told by your doctor. Do not stop using it even if your condition improves.  To help prevent scarring, make sure to cover your wound with sunscreen whenever you are outside after stitches are removed, after adhesive strips are removed, or when glue remains in place and the wound is healed. Make sure to wear a sunscreen of at least 30 SPF.  Do not scratch or pick at the wound.  Keep all follow-up visits as told by your health care provider. This is important.  Check your wound every day for signs of infection. Watch for:  Redness, swelling, or pain.  Fluid, blood, or pus.  Raise (elevate) the injured area above the level of your heart  while you are sitting or lying down, if possible. SEEK MEDICAL CARE IF:  You received a tetanus shot and you have swelling, severe pain, redness, or bleeding at the injection site.  You have a fever.  A wound that was closed breaks open.  You notice a bad smell coming from your wound or your dressing.  You notice something coming out of the wound, such as wood or glass.  Your pain is not controlled with medicine.  You have increased redness, swelling, or pain at the site of your wound.  You have fluid, blood, or pus coming from your wound.  You notice a change in the color of your skin near your wound.  You need to change the dressing frequently due to fluid, blood, or pus draining from the wound.  You develop a new rash.  You develop numbness around the wound. SEEK IMMEDIATE MEDICAL CARE IF:  You develop severe swelling around the wound.  Your pain suddenly increases and is severe.  You develop painful lumps near the wound or on skin that is anywhere on your body.  You have a red streak going away from your wound.  The wound is on your hand or foot and you cannot properly move a finger or toe.  The wound is on your hand or foot and you notice that your fingers or toes look pale or bluish.   This information is not intended to replace advice given to you by your health care provider. Make sure you discuss any questions you have with your health care provider.   Document Released: 01/04/2005 Document Revised: 05/21/2014 Document Reviewed: 12/31/2013 Elsevier Interactive Patient Education 2016 [employer]  Nonsutured Laceration Care A laceration is a cut that goes through all layers of the skin and extends into the tissue that is right under the skin. This type of cut is usually stitched up (sutured) or closed with tape (adhesive strips) or skin glue shortly after the injury happens. However, if the wound is dirty or if several hours pass before medical treatment is  provided, it is likely that germs (bacteria) will enter the wound. Closing a laceration after bacteria have entered it increases the risk of infection. In these cases, your health care provider may leave the laceration open (nonsutured) and cover it with a bandage. This type of treatment helps prevent infection and allows the wound to heal from the deepest layer of tissue damage up to the surface. An open fracture is a type of injury that may involve nonsutured lacerations. An open fracture is a break in a bone that happens along with one or more lacerations through the skin that  is near the fracture site. HOW TO CARE FOR YOUR NONSUTURED LACERATION  Take or apply over-the-counter and prescription medicines only as told by your health care provider.  If you were prescribed an antibiotic medicine, take or apply it as told by your health care provider. Do not stop using the antibiotic even if your condition improves.  Clean the wound one time each day or as told by your health care provider.  Wash the wound with mild soap and water.  Rinse the wound with water to remove all soap.  Pat your wound dry with a clean towel. Do not rub the wound.  Do not inject anything into the wound unless your health care provider told you to.  Change any bandages (dressings) as told by your health care provider. This includes changing the dressing if it gets wet, dirty, or starts to smell bad.  Keep the dressing dry until your health care provider says it can be removed. Do not take baths, swim, or do anything that puts your wound underwater until your health care provider approves.  Raise (elevate) the injured area above the level of your heart while you are sitting or lying down, if possible.  Do not scratch or pick at the wound.  Check your wound every day for signs of infection. Watch for:  Redness, swelling, or pain.  Fluid, blood, or pus.  Keep all follow-up visits as told by your health care  provider. This is important. SEEK MEDICAL CARE IF:  You received a tetanus and shot and you have swelling, severe pain, redness, or bleeding at the injection site.   You have a fever.  Your pain is not controlled with medicine.  You have increased redness, swelling, or pain at the site of your wound.  You have fluid, blood, or pus coming from your wound.  You notice a bad smell coming from your wound or your dressing.  You notice something coming out of the wound, such as wood or glass.  You notice a change in the color of your skin near your wound.  You develop a new rash.  You need to change the dressing frequently due to fluid, blood, or pus draining from the wound.  You develop numbness around your wound. SEEK IMMEDIATE MEDICAL CARE IF:  Your pain suddenly increases and is severe.  You develop severe swelling around the wound.  The wound is on your hand or foot and you cannot properly move a finger or toe.  The wound is on your hand or foot and you notice that your fingers or toes look pale or bluish.  You have a red streak going away from your wound.   This information is not intended to replace advice given to you by your health care provider. Make sure you discuss any questions you have with your health care provider.   Document Released: 12/02/2005 Document Revised: 05/21/2014 Document Reviewed: 12/31/2013 Elsevier Interactive Patient Education [employer].

## 2015-07-15 NOTE — ED Notes (Addendum)
Pt presents with left lower ext laceration, started bleeding in the shower while pt was shaving. Pt states blood was "squirting". Pt noted to have pressure dressing in place on arrival. Pt is not taking any blood thinners at this time.

## 2015-09-03 ENCOUNTER — Encounter: Payer: Self-pay | Admitting: *Deleted

## 2015-11-04 ENCOUNTER — Encounter: Payer: Self-pay | Admitting: *Deleted

## 2015-11-10 ENCOUNTER — Encounter: Payer: Self-pay | Admitting: General Surgery

## 2015-11-10 ENCOUNTER — Ambulatory Visit (INDEPENDENT_AMBULATORY_CARE_PROVIDER_SITE_OTHER): Payer: BLUE CROSS/BLUE SHIELD | Admitting: General Surgery

## 2015-11-10 VITALS — BP 126/72 | HR 76 | Resp 12 | Ht 64.0 in | Wt 213.0 lb

## 2015-11-10 DIAGNOSIS — I8393 Asymptomatic varicose veins of bilateral lower extremities: Secondary | ICD-10-CM

## 2015-11-10 NOTE — Progress Notes (Signed)
Patient ID: Bridget Roach, female   DOB: 1955/04/21, 60 y.o.   MRN: [id number]  Chief Complaint  Patient presents with  . Follow-up    varicose vein    HPI Bridget Roach is a 60 y.o. female here today for a one year varicose vein follow up. Patient states she is doing well, no new changes. Patient is still wearing compression stockings daily. No leg pain, shortness of breath or chest pains. I have reviewed the history of present illness with the patient.  HPI  Past Medical History:  Diagnosis Date  . Dysrhythmia   . Full dentures    upper and lower  . GERD (gastroesophageal reflux disease)   . Hypertension   . Motion sickness    all moving vehicles  . Skin cancer   . Toxoplasmosis    eyes  . Varicose veins   . Vertigo    none in years    Past Surgical History:  Procedure Laterality Date  . ABLATION SAPHENOUS VEIN W/ RFA Right 2010   Dr Jamal Collin  . CHOLECYSTECTOMY    . COLONOSCOPY WITH PROPOFOL N/A 08/26/2014   Procedure: COLONOSCOPY WITH PROPOFOL;  Surgeon: Lucilla Lame, MD;  Location: [facility];  Service: Endoscopy;  Laterality: N/A;  . EYE SURGERY    . SKIN CANCER EXCISION      Family History  Problem Relation Age of Onset  . Cancer Mother     lung  . Stroke Father     Social History Social History  Substance Use Topics  . Smoking status: Never Smoker  . Smokeless tobacco: Never Used  . Alcohol use No    No Known Allergies  Current Outpatient Prescriptions  Medication Sig Dispense Refill  . atropine 1 % ophthalmic solution Place 1 drop into the right eye 2 (two) times daily.   0  . Calcium Carbonate (CALCIUM 600 PO) Take 1 tablet by mouth daily.    . citalopram (CELEXA) 20 MG tablet Take 20 mg by mouth daily.   1  . levothyroxine (SYNTHROID, LEVOTHROID) 25 MCG tablet Take 25 mcg by mouth daily before breakfast.    . lisinopril-hydrochlorothiazide (PRINZIDE,ZESTORETIC) 20-25 MG per tablet Take 1 tablet by mouth daily.   1  . lovastatin  (MEVACOR) 20 MG tablet Take 20 mg by mouth daily at 6 PM.   1  . metoprolol tartrate (LOPRESSOR) 25 MG tablet Take 25 mg by mouth 2 (two) times daily.   0  . naproxen (NAPROSYN) 500 MG tablet Take 1 tablet (500 mg total) by mouth 2 (two) times daily with a meal. 60 tablet 0  . omeprazole (PRILOSEC) 20 MG capsule Take 20 mg by mouth daily.   0  . Vitamin D, Ergocalciferol, (DRISDOL) 50000 UNITS CAPS capsule Take 50,000 Units by mouth every 7 (seven) days.   0   No current facility-administered medications for this visit.     Review of Systems Review of Systems  Constitutional: Negative.   Respiratory: Negative.   Cardiovascular: Negative.     Blood pressure 126/72, pulse 76, resp. rate 12, height 5\' 4"  (1.626 m), weight 213 lb (96.6 kg).  Physical Exam Physical Exam  Constitutional: She is oriented to person, place, and time. She appears well-developed and well-nourished.  Eyes: Conjunctivae are normal. No scleral icterus.  Cardiovascular: Normal rate, regular rhythm, normal heart sounds and intact distal pulses.   Pulses:      Dorsalis pedis pulses are 2+ on the right side, and 2+ on  the left side.       Posterior tibial pulses are 2+ on the right side, and 2+ on the left side.  Edema in left lower leg extending to knee. VV seen in left leg. Few on right. No skin changes or tenderness in leags/calves  Pulmonary/Chest: Effort normal and breath sounds normal.  Neurological: She is alert and oriented to person, place, and time.  Skin: Skin is warm and dry.  Scattered spider angiomata.  Data Reviewed Previous notes  Assessment    Varicose veins, s/p RF ablation of right GSV. Left GSV with noted reflux. Both sides are asymptomatic   Pt does wish to have the spider veins injected as they sometimes cause some discomfort  Plan    Continue use of compression hose. Recheck in 1 yr Plan to schedule appointment for spider vein therapy.        Carmelo Reidel G 11/11/2015,  3:09 PM

## 2015-11-11 ENCOUNTER — Encounter: Payer: Self-pay | Admitting: General Surgery

## 2015-11-26 ENCOUNTER — Encounter: Payer: Self-pay | Admitting: General Surgery

## 2015-11-26 ENCOUNTER — Ambulatory Visit (INDEPENDENT_AMBULATORY_CARE_PROVIDER_SITE_OTHER): Payer: Self-pay | Admitting: General Surgery

## 2015-11-26 VITALS — BP 108/64 | HR 70 | Resp 14 | Ht 64.0 in | Wt 213.0 lb

## 2015-11-26 DIAGNOSIS — I8393 Asymptomatic varicose veins of bilateral lower extremities: Secondary | ICD-10-CM

## 2015-11-26 NOTE — Progress Notes (Signed)
Patient ID: Bridget Roach, female   DOB: 1955/12/09, 60 y.o.   MRN: [id number]  Chief Complaint  Patient presents with  . Procedure    vein injection    HPI Bridget Roach is a 60 y.o. female.  Here today for bilateral spider vein injections lower legs. I have reviewed the history of present illness with the patient.  HPI  Past Medical History:  Diagnosis Date  . Dysrhythmia   . Full dentures    upper and lower  . GERD (gastroesophageal reflux disease)   . Hypertension   . Motion sickness    all moving vehicles  . Skin cancer   . Toxoplasmosis    eyes  . Varicose veins   . Vertigo    none in years    Past Surgical History:  Procedure Laterality Date  . ABLATION SAPHENOUS VEIN W/ RFA Right 2010   Dr Jamal Collin  . CHOLECYSTECTOMY    . COLONOSCOPY WITH PROPOFOL N/A 08/26/2014   Procedure: COLONOSCOPY WITH PROPOFOL;  Surgeon: Lucilla Lame, MD;  Location: [facility];  Service: Endoscopy;  Laterality: N/A;  . EYE SURGERY    . SKIN CANCER EXCISION      Family History  Problem Relation Age of Onset  . Cancer Mother     lung  . Stroke Father     Social History Social History  Substance Use Topics  . Smoking status: Never Smoker  . Smokeless tobacco: Never Used  . Alcohol use No    No Known Allergies  Current Outpatient Prescriptions  Medication Sig Dispense Refill  . atropine 1 % ophthalmic solution Place 1 drop into the right eye 2 (two) times daily.   0  . Calcium Carbonate (CALCIUM 600 PO) Take 1 tablet by mouth daily.    . citalopram (CELEXA) 20 MG tablet Take 20 mg by mouth daily.   1  . levothyroxine (SYNTHROID, LEVOTHROID) 25 MCG tablet Take 25 mcg by mouth daily before breakfast.    . lisinopril-hydrochlorothiazide (PRINZIDE,ZESTORETIC) 20-25 MG per tablet Take 1 tablet by mouth daily.   1  . lovastatin (MEVACOR) 20 MG tablet Take 20 mg by mouth daily at 6 PM.   1  . metoprolol tartrate (LOPRESSOR) 25 MG tablet Take 25 mg by mouth 2 (two) times  daily.   0  . naproxen (NAPROSYN) 500 MG tablet Take 1 tablet (500 mg total) by mouth 2 (two) times daily with a meal. 60 tablet 0  . omeprazole (PRILOSEC) 20 MG capsule Take 20 mg by mouth daily.   0  . Vitamin D, Ergocalciferol, (DRISDOL) 50000 UNITS CAPS capsule Take 50,000 Units by mouth every 7 (seven) days.   0   No current facility-administered medications for this visit.     Review of Systems Review of Systems  Constitutional: Negative.   Respiratory: Negative.   Cardiovascular: Negative.     There were no vitals taken for this visit.  Physical Exam Physical Exam  Constitutional: She is oriented to person, place, and time. She appears well-developed and well-nourished.  Neurological: She is alert and oriented to person, place, and time.  Skin: Skin is warm and dry.  Psychiatric: Her behavior is normal.    Data Reviewed Progress notes  Assessment    Varicose veins, s/p RF ablation of right GSV. Left GSV with noted reflux.    Near both ankles she scattered spider veins, mildly symptomatic. Areas were prepped with alcohol and several injections of 1% Sotradecol  made in  the veins. Total used 34ml. No immediate problem after the procedure. Injected sites covered with cotton balls and tape.     Plan    Return in 2-3 weeks for completion of sclero therapy     This information has been scribed by Karie Fetch RN, BSN,BC.   Karie Fetch M 11/26/2015, 1:55 PM

## 2015-12-10 ENCOUNTER — Encounter: Payer: Self-pay | Admitting: General Surgery

## 2015-12-10 ENCOUNTER — Ambulatory Visit (INDEPENDENT_AMBULATORY_CARE_PROVIDER_SITE_OTHER): Payer: BLUE CROSS/BLUE SHIELD | Admitting: General Surgery

## 2015-12-10 VITALS — BP 132/76 | HR 78 | Resp 12 | Ht 64.0 in | Wt 213.0 lb

## 2015-12-10 DIAGNOSIS — I8393 Asymptomatic varicose veins of bilateral lower extremities: Secondary | ICD-10-CM

## 2015-12-10 NOTE — Progress Notes (Signed)
Patient ID: Bridget Roach, female   DOB: 11/28/1955, 60 y.o.   MRN: [id number]  Chief Complaint  Patient presents with  . Procedure    HPI Bridget Roach is a 60 y.o. female.  Here today for bilateral spider vein injections lower legs.She had several injected 3 weeks ago. I have reviewed the history of present illness with the patient.    HPI  Past Medical History:  Diagnosis Date  . Dysrhythmia   . Full dentures    upper and lower  . GERD (gastroesophageal reflux disease)   . Hypertension   . Motion sickness    all moving vehicles  . Skin cancer   . Toxoplasmosis    eyes  . Varicose veins   . Vertigo    none in years    Past Surgical History:  Procedure Laterality Date  . ABLATION SAPHENOUS VEIN W/ RFA Right 2010   Dr Jamal Collin  . CHOLECYSTECTOMY    . COLONOSCOPY WITH PROPOFOL N/A 08/26/2014   Procedure: COLONOSCOPY WITH PROPOFOL;  Surgeon: Lucilla Lame, MD;  Location: [facility];  Service: Endoscopy;  Laterality: N/A;  . EYE SURGERY    . SKIN CANCER EXCISION      Family History  Problem Relation Age of Onset  . Cancer Mother     lung  . Stroke Father     Social History Social History  Substance Use Topics  . Smoking status: Never Smoker  . Smokeless tobacco: Never Used  . Alcohol use No    No Known Allergies  Current Outpatient Prescriptions  Medication Sig Dispense Refill  . atropine 1 % ophthalmic solution Place 1 drop into the right eye 2 (two) times daily.   0  . Calcium Carbonate (CALCIUM 600 PO) Take 1 tablet by mouth daily.    . citalopram (CELEXA) 20 MG tablet Take 20 mg by mouth daily.   1  . levothyroxine (SYNTHROID, LEVOTHROID) 25 MCG tablet Take 25 mcg by mouth daily before breakfast.    . lisinopril-hydrochlorothiazide (PRINZIDE,ZESTORETIC) 20-25 MG per tablet Take 1 tablet by mouth daily.   1  . lovastatin (MEVACOR) 20 MG tablet Take 20 mg by mouth daily at 6 PM.   1  . metoprolol tartrate (LOPRESSOR) 25 MG tablet Take 25 mg by  mouth 2 (two) times daily.   0  . naproxen (NAPROSYN) 500 MG tablet Take 1 tablet (500 mg total) by mouth 2 (two) times daily with a meal. 60 tablet 0  . omeprazole (PRILOSEC) 20 MG capsule Take 20 mg by mouth daily.   0  . Vitamin D, Ergocalciferol, (DRISDOL) 50000 UNITS CAPS capsule Take 50,000 Units by mouth every 7 (seven) days.   0   No current facility-administered medications for this visit.     Review of Systems Review of Systems  Constitutional: Negative.   Respiratory: Negative.   Cardiovascular: Negative.     Blood pressure 132/76, pulse 78, resp. rate 12, height 5\' 4"  (1.626 m), weight 213 lb (96.6 kg).  Physical Exam Physical Exam  Constitutional: She is oriented to person, place, and time. She appears well-developed and well-nourished.  Neurological: She is alert and oriented to person, place, and time.  Skin: Skin is warm and dry.  Psychiatric: Her behavior is normal.  Small scab noted at injection site above right ankle. No other problems noted from prior sclerotherapy  Data Reviewed Progress notes   Assessment    Varicose veins, s/p RF ablation of right GSV. Left GSV  with noted reflux.    Near both ankles   scattered spider veins, mildly symptomatic    Plan   Multiple sites on both legs were injected today with 1% sotradecol. No immediate problems from procedure.       This information has been scribed by Gaspar Cola CMA.   Accalia Rigdon G 12/16/2015, 8:04 AM

## 2015-12-10 NOTE — Patient Instructions (Signed)
Wear compression hose. 

## 2015-12-16 ENCOUNTER — Encounter: Payer: Self-pay | Admitting: General Surgery

## 2016-01-08 ENCOUNTER — Ambulatory Visit (INDEPENDENT_AMBULATORY_CARE_PROVIDER_SITE_OTHER): Payer: BLUE CROSS/BLUE SHIELD | Admitting: General Surgery

## 2016-01-08 ENCOUNTER — Encounter: Payer: Self-pay | Admitting: General Surgery

## 2016-01-08 VITALS — BP 116/64 | HR 78 | Resp 14 | Ht 64.0 in | Wt 212.0 lb

## 2016-01-08 DIAGNOSIS — I8393 Asymptomatic varicose veins of bilateral lower extremities: Secondary | ICD-10-CM

## 2016-01-08 NOTE — Patient Instructions (Signed)
Return as needed

## 2016-01-08 NOTE — Progress Notes (Signed)
Patient ID: Bridget Roach, female   DOB: 1955-02-18, 60 y.o.   MRN: [id number]  Chief Complaint  Patient presents with  . Follow-up    HPI Bridget Roach is a 60 y.o. female here today for her follow up spider veins post sclerotherapy. Patient states she is doing well, I have reviewed the history of present illness with the patient.  HPI  Past Medical History:  Diagnosis Date  . Dysrhythmia   . Full dentures    upper and lower  . GERD (gastroesophageal reflux disease)   . Hypertension   . Motion sickness    all moving vehicles  . Skin cancer   . Toxoplasmosis    eyes  . Varicose veins   . Vertigo    none in years    Past Surgical History:  Procedure Laterality Date  . ABLATION SAPHENOUS VEIN W/ RFA Right 2010   Dr Jamal Collin  . CHOLECYSTECTOMY    . COLONOSCOPY WITH PROPOFOL N/A 08/26/2014   Procedure: COLONOSCOPY WITH PROPOFOL;  Surgeon: Lucilla Lame, MD;  Location: [facility];  Service: Endoscopy;  Laterality: N/A;  . EYE SURGERY    . SKIN CANCER EXCISION      Family History  Problem Relation Age of Onset  . Cancer Mother     lung  . Stroke Father     Social History Social History  Substance Use Topics  . Smoking status: Never Smoker  . Smokeless tobacco: Never Used  . Alcohol use No    No Known Allergies  Current Outpatient Prescriptions  Medication Sig Dispense Refill  . atropine 1 % ophthalmic solution Place 1 drop into the right eye 2 (two) times daily.   0  . Calcium Carbonate (CALCIUM 600 PO) Take 1 tablet by mouth daily.    . citalopram (CELEXA) 20 MG tablet Take 20 mg by mouth daily.   1  . levothyroxine (SYNTHROID, LEVOTHROID) 25 MCG tablet Take 25 mcg by mouth daily before breakfast.    . lisinopril-hydrochlorothiazide (PRINZIDE,ZESTORETIC) 20-25 MG per tablet Take 1 tablet by mouth daily.   1  . lovastatin (MEVACOR) 20 MG tablet Take 20 mg by mouth daily at 6 PM.   1  . metoprolol tartrate (LOPRESSOR) 25 MG tablet Take 25 mg by mouth  2 (two) times daily.   0  . naproxen (NAPROSYN) 500 MG tablet Take 1 tablet (500 mg total) by mouth 2 (two) times daily with a meal. 60 tablet 0  . omeprazole (PRILOSEC) 20 MG capsule Take 20 mg by mouth daily.   0  . Vitamin D, Ergocalciferol, (DRISDOL) 50000 UNITS CAPS capsule Take 50,000 Units by mouth every 7 (seven) days.   0   No current facility-administered medications for this visit.     Review of Systems Review of Systems  Constitutional: Negative.   Respiratory: Negative.   Cardiovascular: Negative.     Blood pressure 116/64, pulse 78, resp. rate 14, height 5\' 4"  (1.626 m), weight 212 lb (96.2 kg).  Physical Exam Physical Exam Lower legs on both sides sohws tiny scabs where injections were made . Most if not al of the injected spider veins are not visible now.  Data Reviewed None  Assessment    Goo results post sclerotherapy lower leg spider angiomata    Plan       Patient to return as needed.   This information has been scribed by Karie Fetch RN, BSN,BC.   Rennie Rouch G 01/08/2016, 2:55 PM

## 2016-07-22 ENCOUNTER — Other Ambulatory Visit: Payer: Self-pay | Admitting: Family Medicine

## 2016-07-22 DIAGNOSIS — J398 Other specified diseases of upper respiratory tract: Secondary | ICD-10-CM

## 2016-07-22 DIAGNOSIS — R9389 Abnormal findings on diagnostic imaging of other specified body structures: Secondary | ICD-10-CM

## 2016-07-27 ENCOUNTER — Ambulatory Visit
Admission: RE | Admit: 2016-07-27 | Discharge: 2016-07-27 | Disposition: A | Discharge status: Home / Self Care | Payer: BLUE CROSS/BLUE SHIELD | Source: Ambulatory Visit | Attending: Family Medicine | Admitting: Family Medicine

## 2016-07-27 DIAGNOSIS — E040 Nontoxic diffuse goiter: Secondary | ICD-10-CM | POA: Insufficient documentation

## 2016-07-27 DIAGNOSIS — R9389 Abnormal findings on diagnostic imaging of other specified body structures: Secondary | ICD-10-CM

## 2016-07-27 DIAGNOSIS — E041 Nontoxic single thyroid nodule: Secondary | ICD-10-CM | POA: Diagnosis not present

## 2016-07-27 DIAGNOSIS — J398 Other specified diseases of upper respiratory tract: Secondary | ICD-10-CM | POA: Diagnosis not present

## 2017-03-28 ENCOUNTER — Other Ambulatory Visit: Payer: Self-pay | Admitting: Family Medicine

## 2017-03-28 DIAGNOSIS — Z1239 Encounter for other screening for malignant neoplasm of breast: Secondary | ICD-10-CM

## 2017-04-14 ENCOUNTER — Encounter (INDEPENDENT_AMBULATORY_CARE_PROVIDER_SITE_OTHER): Payer: Self-pay

## 2017-04-14 ENCOUNTER — Other Ambulatory Visit: Payer: Self-pay | Admitting: Family Medicine

## 2017-04-14 ENCOUNTER — Ambulatory Visit
Admission: RE | Admit: 2017-04-14 | Discharge: 2017-04-14 | Disposition: A | Discharge status: Home / Self Care | Payer: BLUE CROSS/BLUE SHIELD | Source: Ambulatory Visit | Attending: Family Medicine | Admitting: Family Medicine

## 2017-04-14 DIAGNOSIS — Z1239 Encounter for other screening for malignant neoplasm of breast: Secondary | ICD-10-CM

## 2017-04-14 DIAGNOSIS — Z1231 Encounter for screening mammogram for malignant neoplasm of breast: Secondary | ICD-10-CM | POA: Insufficient documentation

## 2019-05-23 ENCOUNTER — Other Ambulatory Visit: Payer: Self-pay | Admitting: Family Medicine

## 2019-05-23 DIAGNOSIS — Z1231 Encounter for screening mammogram for malignant neoplasm of breast: Secondary | ICD-10-CM

## 2019-06-04 ENCOUNTER — Ambulatory Visit
Admission: RE | Admit: 2019-06-04 | Discharge: 2019-06-04 | Disposition: A | Discharge status: Home / Self Care | Payer: Medicare Other | Source: Ambulatory Visit | Attending: Family Medicine | Admitting: Family Medicine

## 2019-06-04 ENCOUNTER — Other Ambulatory Visit: Payer: Self-pay

## 2019-06-04 DIAGNOSIS — Z1231 Encounter for screening mammogram for malignant neoplasm of breast: Secondary | ICD-10-CM

## 2020-05-05 ENCOUNTER — Other Ambulatory Visit: Payer: Self-pay | Admitting: Family Medicine

## 2020-05-05 DIAGNOSIS — Z1231 Encounter for screening mammogram for malignant neoplasm of breast: Secondary | ICD-10-CM

## 2020-06-04 ENCOUNTER — Ambulatory Visit
Admission: RE | Admit: 2020-06-04 | Discharge: 2020-06-04 | Disposition: A | Discharge status: Home / Self Care | Payer: Medicare Other | Source: Ambulatory Visit | Attending: Family Medicine | Admitting: Family Medicine

## 2020-06-04 ENCOUNTER — Other Ambulatory Visit: Payer: Self-pay

## 2020-06-04 DIAGNOSIS — Z1231 Encounter for screening mammogram for malignant neoplasm of breast: Secondary | ICD-10-CM | POA: Insufficient documentation

## 2021-12-16 ENCOUNTER — Ambulatory Visit
Admission: EM | Admit: 2021-12-16 | Discharge: 2021-12-16 | Disposition: A | Discharge status: Home / Self Care | Payer: Medicare Other | Attending: Nurse Practitioner | Admitting: Nurse Practitioner

## 2021-12-16 DIAGNOSIS — N3001 Acute cystitis with hematuria: Secondary | ICD-10-CM | POA: Insufficient documentation

## 2021-12-16 LAB — URINALYSIS, COMPLETE (UACMP) WITH MICROSCOPIC
Glucose, UA: NEGATIVE mg/dL
Ketones, ur: 15 mg/dL — AB
Nitrite: POSITIVE — AB
Protein, ur: 30 mg/dL — AB
Specific Gravity, Urine: 1.02 (ref 1.005–1.030)
pH: 5 (ref 5.0–8.0)

## 2021-12-16 MED ORDER — NITROFURANTOIN MONOHYD MACRO 100 MG PO CAPS: 100.0000 mg | ORAL_CAPSULE | Freq: Two times a day (BID) | ORAL | 0 refills | Status: DC

## 2021-12-16 NOTE — Discharge Instructions (Addendum)
You have a Urinary tract infection with trace blood. Your culture has been sent. You have been started on Macrobid 100 mg twice a day for 5 days. I encourage you to also push fluids, water and 100% cranberry juice.  Call or return to clinic prn if these symptoms worsen or fail to improve as anticipated.  Please follow up for a repeat urinalysis at your PCP office to make sure the blood has cleared in your urine.

## 2021-12-16 NOTE — ED Provider Notes (Signed)
[facility]    CSN: [id number] Arrival date & time: 12/16/21  1527      History   Chief Complaint Chief Complaint  Patient presents with   Back Pain    HPI RYLEI MASELLA is a 66 y.o. female.   HPI  She is complaining of 2 days of right sided back pain with urinary frequency. She denies any injury ore strenuous activity. She has been decoration but nothing out of the ordinary . The pain is worse with standing and almost brings her to tears. She denies fever, chills, nausea, abdominal pain. She denies a history of recurrent UTI. She denies any recent antibiotic use.  Past Medical History:  Diagnosis Date   Dysrhythmia    Full dentures    upper and lower   GERD (gastroesophageal reflux disease)    Hypertension    Motion sickness    all moving vehicles   Skin cancer    Toxoplasmosis    eyes   Varicose veins    Vertigo    none in years    Patient Active Problem List   Diagnosis Date Noted   Special screening for malignant neoplasms, colon     Past Surgical History:  Procedure Laterality Date   ABLATION SAPHENOUS VEIN W/ RFA Right 2010   Dr Jamal Collin   CHOLECYSTECTOMY     COLONOSCOPY WITH PROPOFOL N/A 08/26/2014   Procedure: COLONOSCOPY WITH PROPOFOL;  Surgeon: Lucilla Lame, MD;  Location: [facility];  Service: Endoscopy;  Laterality: N/A;   EYE SURGERY     SKIN CANCER EXCISION      OB History     Gravida  2   Para  2   Term      Preterm      AB      Living         SAB      IAB      Ectopic      Multiple      Live Births           Obstetric Comments  1st Menstrual Cycle:  12  1st Pregnancy:  17          Home Medications    Prior to Admission medications   Medication Sig Start Date End Date Taking? Authorizing Provider  nitrofurantoin, macrocrystal-monohydrate, (MACROBID) 100 MG capsule Take 1 capsule (100 mg total) by mouth 2 (two) times daily. 12/16/21  Yes Vevelyn Francois, NP  atropine 1 % ophthalmic  solution Place 1 drop into the right eye 2 (two) times daily.  06/21/14   [provider]  Calcium Carbonate (CALCIUM 600 PO) Take 1 tablet by mouth daily.    [provider]  citalopram (CELEXA) 20 MG tablet Take 20 mg by mouth daily.  07/01/14   [provider]  levothyroxine (SYNTHROID, LEVOTHROID) 25 MCG tablet Take 25 mcg by mouth daily before breakfast.    [provider]  lisinopril-hydrochlorothiazide (PRINZIDE,ZESTORETIC) 20-25 MG per tablet Take 1 tablet by mouth daily.  06/10/14   [provider]  lovastatin (MEVACOR) 20 MG tablet Take 20 mg by mouth daily at 6 PM.  07/01/14   [provider]  metoprolol tartrate (LOPRESSOR) 25 MG tablet Take 25 mg by mouth 2 (two) times daily.  06/05/14   [provider]  naproxen (NAPROSYN) 500 MG tablet Take 1 tablet (500 mg total) by mouth 2 (two) times daily with a meal. 03/04/15   Lorin Picket, PA-C  omeprazole (PRILOSEC) 20 MG capsule Take 20 mg by mouth daily.  06/24/14   [provider]  Vitamin D, Ergocalciferol, (DRISDOL) 50000 UNITS CAPS capsule Take 50,000 Units by mouth every 7 (seven) days.  07/01/14   [provider]    Family History Family History  Problem Relation Age of Onset   Cancer Mother        lung   Stroke Father    Breast cancer Neg Hx     Social History Social History   Tobacco Use   Smoking status: Never   Smokeless tobacco: Never  Substance Use Topics   Alcohol use: No    Alcohol/week: 0.0 standard drinks of alcohol   Drug use: No     Allergies   Patient has no known allergies.   Review of Systems Review of Systems   Physical Exam Triage Vital Signs ED Triage Vitals  Enc Vitals Group     BP 12/16/21 1607 109/82     Pulse Rate 12/16/21 1607 62     Resp 12/16/21 1607 16     Temp 12/16/21 1607 98.2 F (36.8 C)     Temp Source 12/16/21 1607 Oral     SpO2 12/16/21 1609 98 %     Weight 12/16/21 1609 195 lb (88.5 kg)      Height 12/16/21 1609 '5\' 4"'$  (1.626 m)     Head Circumference --      Peak Flow --      Pain Score 12/16/21 1606 4     Pain Loc --      Pain Edu? --      Excl. in [place]? --    No data found.  Updated Vital Signs BP 109/82 (BP Location: Left Arm)   Pulse 62   Temp 98.2 F (36.8 C) (Oral)   Resp 16   Ht '5\' 4"'$  (1.626 m)   Wt 195 lb (88.5 kg)   SpO2 98%   BMI 33.47 kg/m   Visual Acuity Right Eye Distance:   Left Eye Distance:   Bilateral Distance:    Right Eye Near:   Left Eye Near:    Bilateral Near:     Physical Exam Constitutional:      General: She is not in acute distress. HENT:     Head: Normocephalic.     Mouth/Throat:     Mouth: Mucous membranes are moist.  Cardiovascular:     Rate and Rhythm: Normal rate.  Pulmonary:     Effort: Pulmonary effort is normal.  Musculoskeletal:        General: Normal range of motion.  Skin:    General: Skin is warm.     Capillary Refill: Capillary refill takes less than 2 seconds.  Neurological:     General: No focal deficit present.     Mental Status: She is alert.      UC Treatments / Results  Labs (all labs ordered are listed, but only abnormal results are displayed) Labs Reviewed  URINALYSIS, COMPLETE (UACMP) WITH MICROSCOPIC - Abnormal; Notable for the following components:      Result Value   APPearance HAZY (*)    Hgb urine dipstick TRACE (*)    Bilirubin Urine SMALL (*)    Ketones, ur 15 (*)    Protein, ur 30 (*)    Nitrite POSITIVE (*)    Leukocytes,Ua TRACE (*)    Bacteria, UA MANY (*)    All other components within normal limits  URINE CULTURE  EKG   Radiology No results found.  Procedures Procedures (including critical care time)  Medications Ordered in UC Medications - No data to display  Initial Impression / Assessment and Plan / UC Course  I have reviewed the triage vital signs and the nursing notes.  Pertinent labs & imaging results that were available during my care of the patient  were reviewed by me and considered in my medical decision making (see chart for details).     Back pain  Urinary frequency  Final Clinical Impressions(s) / UC Diagnoses   Final diagnoses:  Acute cystitis with hematuria     Discharge Instructions      You have a Urinary tract infection with trace blood. Your culture has been sent. You have been started on Macrobid 100 mg twice a day for 5 days. I encourage you to also push fluids, water and 100% cranberry juice.  Call or return to clinic prn if these symptoms worsen or fail to improve as anticipated.  Please follow up for a repeat urinalysis at your PCP office to make sure the blood has cleared in your urine.      ED Prescriptions     Medication Sig Dispense Auth. Provider   nitrofurantoin, macrocrystal-monohydrate, (MACROBID) 100 MG capsule Take 1 capsule (100 mg total) by mouth 2 (two) times daily. 10 capsule Vevelyn Francois, NP      PDMP not reviewed this encounter.   Dionisio David [place], [place] 12/16/21 1746

## 2021-12-16 NOTE — ED Triage Notes (Signed)
Patient reports low back pain and frequent urinating x day 2.

## 2021-12-18 LAB — URINE CULTURE: Culture: >=100000 — AB

## 2023-04-11 IMAGING — MG MM DIGITAL SCREENING BILAT W/ TOMO AND CAD
8 series · 8 of 24 positions shown · non-contrast
Comparison: Previous exam(s).

CLINICAL DATA: Screening.

EXAM:
DIGITAL SCREENING BILATERAL MAMMOGRAM WITH TOMOSYNTHESIS AND CAD
TECHNIQUE: Bilateral screening digital craniocaudal and mediolateral oblique
mammograms were obtained. Bilateral screening digital breast
tomosynthesis was performed. The images were evaluated with
computer-aided detection.

[R MLO synth-2D]
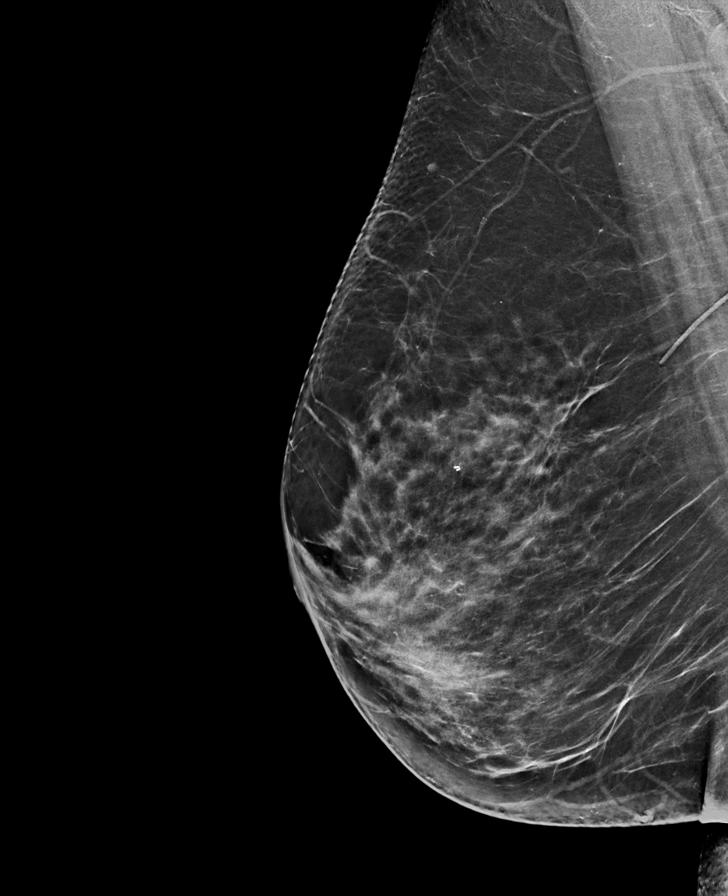

[R CC synth-2D]
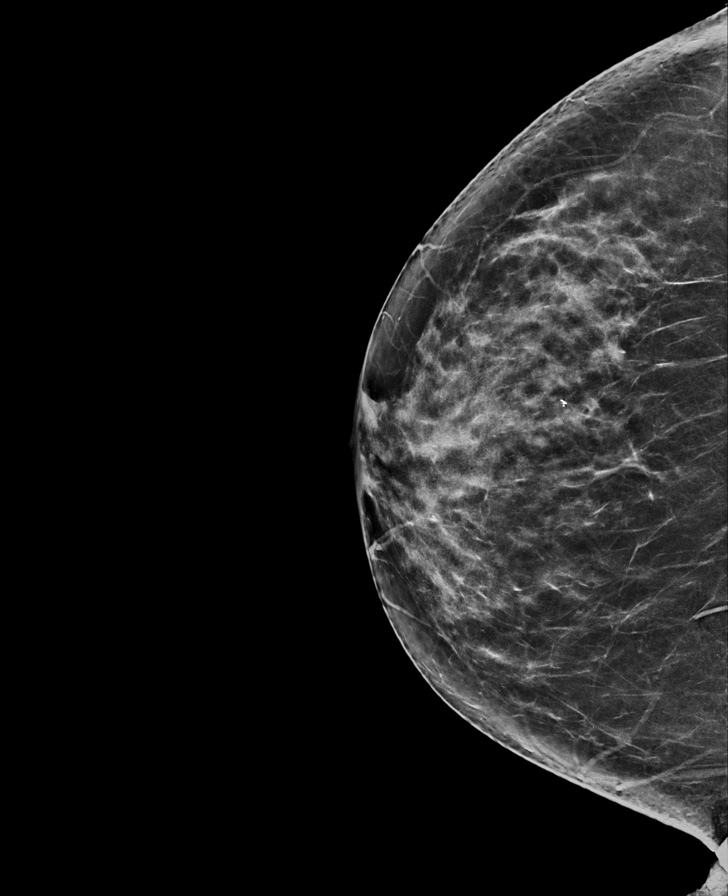

[L CC synth-2D]
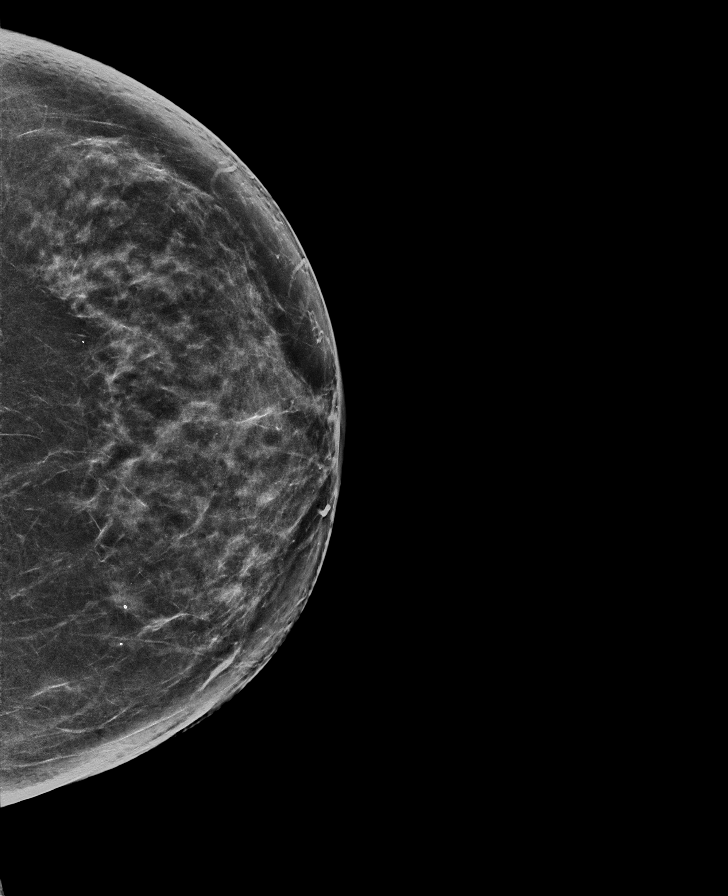

[L MLO synth-2D]
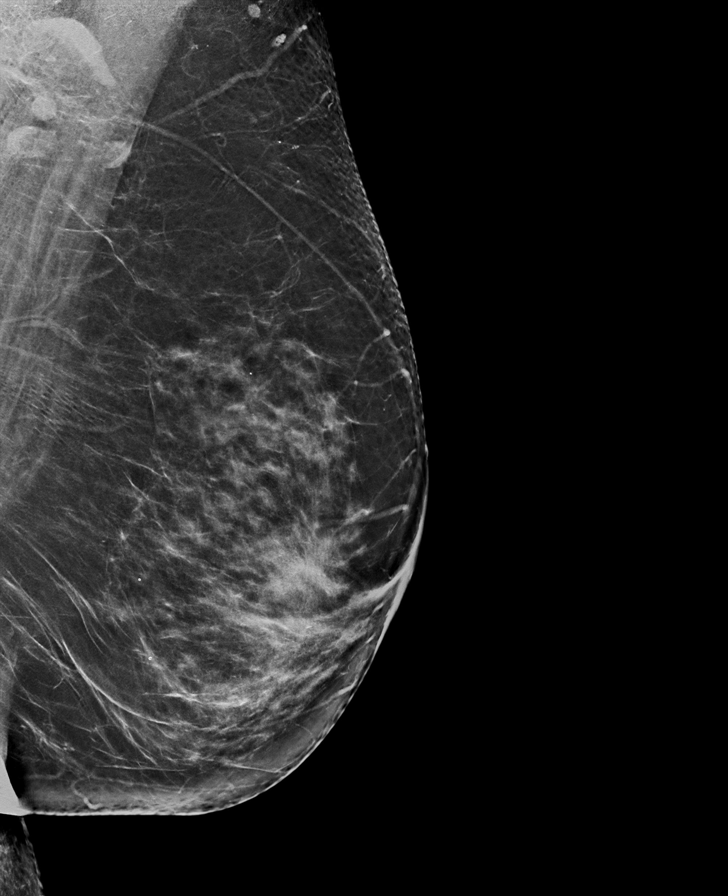

[R MLO tomo · tomo slice 45/88.0]
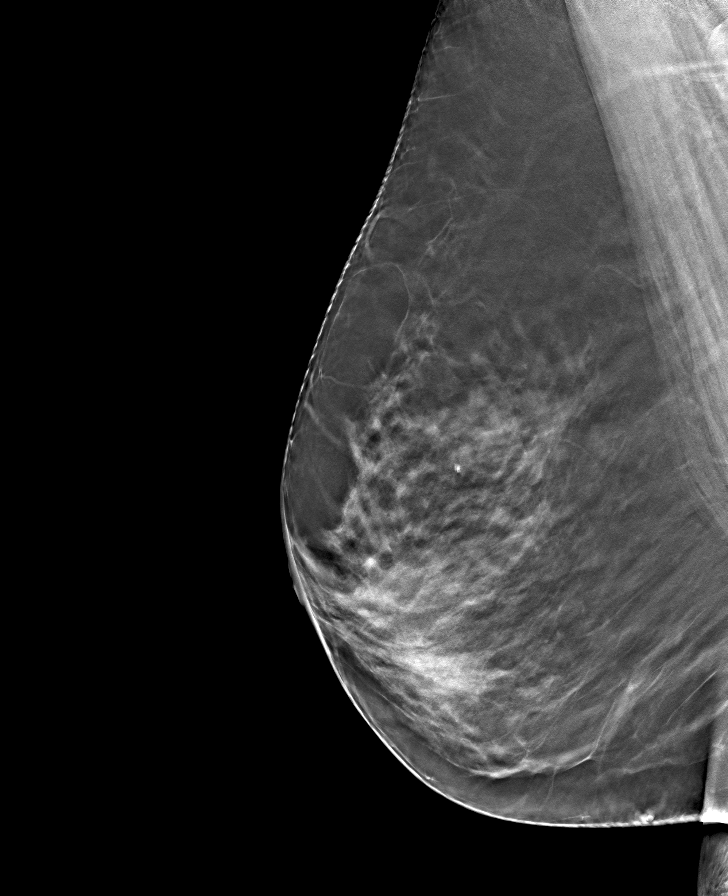

[L MLO tomo · tomo slice 44/87.0]
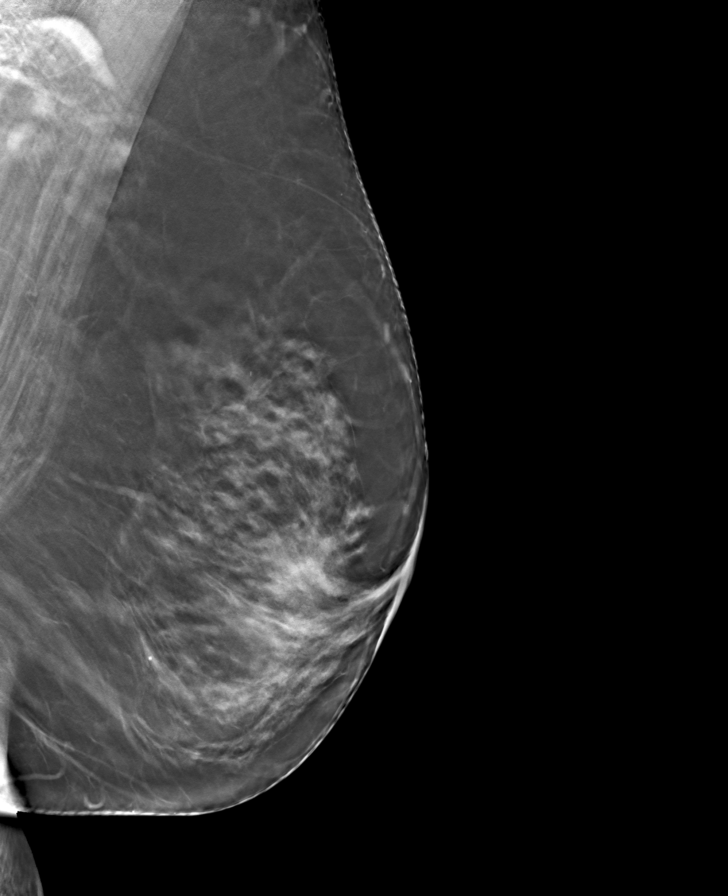

[R CC tomo · tomo slice 41/81.0]
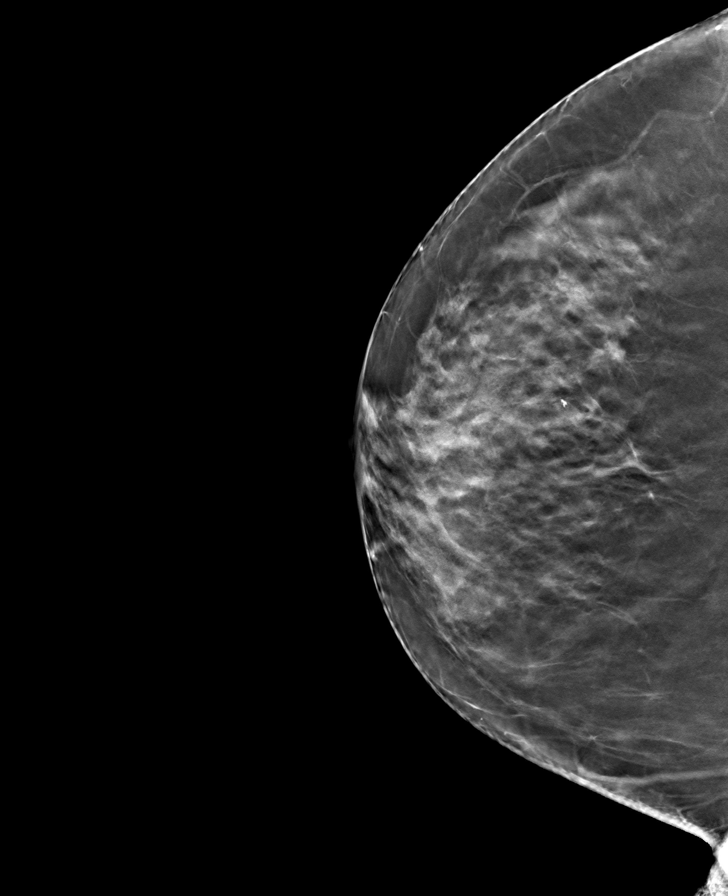

[L CC tomo · tomo slice 43/84.0]
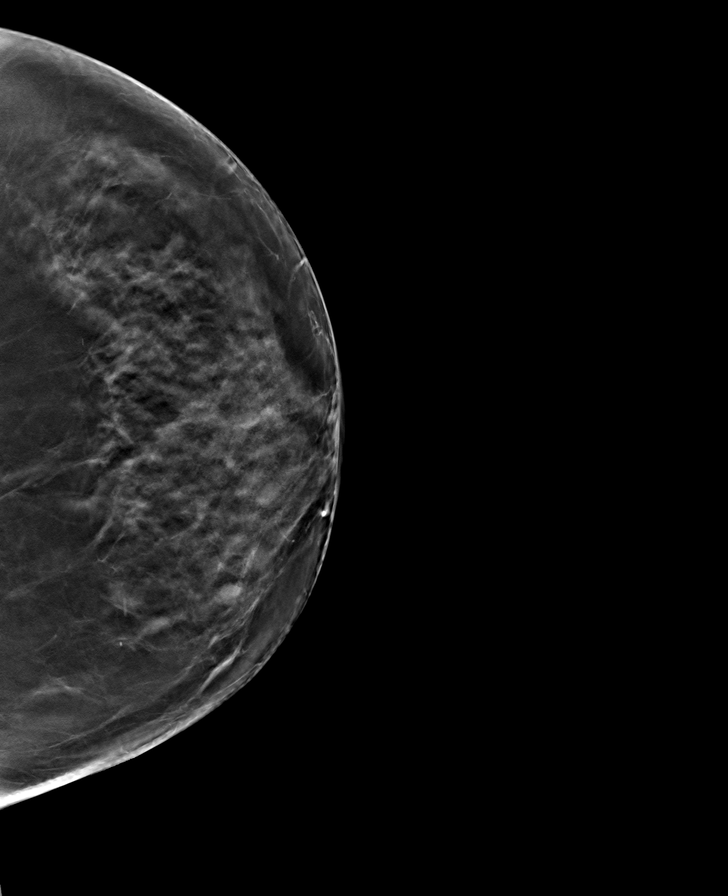

[8 of 24 positions shown; findings below may reference images not displayed]

ACR Breast Density Category c: The breast tissue is heterogeneously
dense, which may obscure small masses.
FINDINGS: There are no findings suspicious for malignancy. The images were
evaluated with computer-aided detection.
IMPRESSION: No mammographic evidence of malignancy. A result letter of this
screening mammogram will be mailed directly to the patient.

RECOMMENDATION:
Screening mammogram in one year. (Code:T4-5-GWO)

BI-RADS CATEGORY  1: Negative.

## 2023-12-10 ENCOUNTER — Ambulatory Visit: Admission: EM | Admit: 2023-12-10 | Discharge: 2023-12-10 | Disposition: A | Discharge status: Home / Self Care

## 2023-12-10 ENCOUNTER — Ambulatory Visit (INDEPENDENT_AMBULATORY_CARE_PROVIDER_SITE_OTHER)

## 2023-12-10 ENCOUNTER — Encounter: Payer: Self-pay | Admitting: Emergency Medicine

## 2023-12-10 DIAGNOSIS — R051 Acute cough: Secondary | ICD-10-CM | POA: Diagnosis not present

## 2023-12-10 DIAGNOSIS — R062 Wheezing: Secondary | ICD-10-CM

## 2023-12-10 MED ORDER — AEROCHAMBER MV MISC: 2 refills | Status: AC

## 2023-12-10 MED ORDER — BENZONATATE 100 MG PO CAPS: 200.0000 mg | ORAL_CAPSULE | Freq: Three times a day (TID) | ORAL | 0 refills | Status: AC

## 2023-12-10 MED ORDER — IPRATROPIUM BROMIDE 0.06 % NA SOLN: 2.0000 | Freq: Four times a day (QID) | NASAL | 12 refills | Status: AC

## 2023-12-10 MED ORDER — DOXYCYCLINE HYCLATE 100 MG PO CAPS: 100.0000 mg | ORAL_CAPSULE | Freq: Two times a day (BID) | ORAL | 0 refills | Status: AC

## 2023-12-10 MED ORDER — ALBUTEROL SULFATE HFA 108 (90 BASE) MCG/ACT IN AERS: 2.0000 | INHALATION_SPRAY | RESPIRATORY_TRACT | 0 refills | Status: AC | PRN

## 2023-12-10 MED ORDER — PROMETHAZINE-DM 6.25-15 MG/5ML PO SYRP: 5.0000 mL | ORAL_SOLUTION | Freq: Four times a day (QID) | ORAL | 0 refills | Status: AC | PRN

## 2023-12-10 NOTE — ED Provider Notes (Signed)
 [facility]    CSN: [id number] Arrival date & time: 12/10/23  9057      History   Chief Complaint Chief Complaint  Patient presents with   Cough   Wheezing    HPI Bridget Roach is a 68 y.o. female.   HPI  68 year old female with past medical history significant for hypertension, dysrhythmia, GERD, vertigo, varicose veins, skin cancer, and toxoplasmosis presents for evaluation of 3 days worth of respiratory symptoms.  She is endorsing runny nose, nasal congestion, and a cough that is intermittently productive for sputum.  She states that the sputum is not dark or discolored.  She has had some wheezing but she denies any shortness of breath.  No fever.  She was seen by her PCP 3 days ago and had PCR testing for COVID, influenza, and RSV.  She is unsure of the results.  Past Medical History:  Diagnosis Date   Dysrhythmia    Full dentures    upper and lower   GERD (gastroesophageal reflux disease)    Hypertension    Motion sickness    all moving vehicles   Skin cancer    Toxoplasmosis    eyes   Varicose veins    Vertigo    none in years    Patient Active Problem List   Diagnosis Date Noted   Special screening for malignant neoplasms, colon     Past Surgical History:  Procedure Laterality Date   ABLATION SAPHENOUS VEIN W/ RFA Right 2010   Dr Dellie   CHOLECYSTECTOMY     COLONOSCOPY WITH PROPOFOL  N/A 08/26/2014   Procedure: COLONOSCOPY WITH PROPOFOL ;  Surgeon: Rogelia Copping, MD;  Location: [facility];  Service: Endoscopy;  Laterality: N/A;   EYE SURGERY     SKIN CANCER EXCISION      OB History     Gravida  2   Para  2   Term      Preterm      AB      Living         SAB      IAB      Ectopic      Multiple      Live Births           Obstetric Comments  1st Menstrual Cycle:  12  1st Pregnancy:  17          Home Medications    Prior to Admission medications   Medication Sig Start Date End Date Taking?  Authorizing Provider  albuterol  (VENTOLIN  HFA) 108 (90 Base) MCG/ACT inhaler Inhale 2 puffs into the lungs every 4 (four) hours as needed. 12/10/23  Yes Bernardino Ditch, NP  benzonatate  (TESSALON ) 100 MG capsule Take 2 capsules (200 mg total) by mouth every 8 (eight) hours. 12/10/23  Yes Bernardino Ditch, NP  doxycycline  (VIBRAMYCIN ) 100 MG capsule Take 1 capsule (100 mg total) by mouth 2 (two) times daily for 7 days. 12/10/23 12/17/23 Yes Bernardino Ditch, NP  ELIQUIS 5 MG TABS tablet Take 5 mg by mouth 2 (two) times daily. 04/29/17  Yes [provider]  ipratropium (ATROVENT ) 0.06 % nasal spray Place 2 sprays into both nostrils 4 (four) times daily. 12/10/23  Yes Bernardino Ditch, NP  levothyroxine (SYNTHROID, LEVOTHROID) 25 MCG tablet Take 25 mcg by mouth daily before breakfast.   Yes [provider]  lisinopril-hydrochlorothiazide (PRINZIDE,ZESTORETIC) 20-25 MG per tablet Take 1 tablet by mouth daily.  06/10/14  Yes [provider]  lovastatin (  MEVACOR) 20 MG tablet Take 20 mg by mouth daily at 6 PM.  07/01/14  Yes [provider]  metoprolol tartrate (LOPRESSOR) 25 MG tablet Take 25 mg by mouth 2 (two) times daily.  06/05/14  Yes [provider]  MULTAQ 400 MG tablet Take 400 mg by mouth 2 (two) times daily.   Yes [provider]  omeprazole (PRILOSEC) 20 MG capsule Take 20 mg by mouth daily.  06/24/14  Yes [provider]  promethazine -dextromethorphan (PROMETHAZINE -DM) 6.25-15 MG/5ML syrup Take 5 mLs by mouth 4 (four) times daily as needed. 12/10/23  Yes Bernardino Ditch, NP  sertraline (ZOLOFT) 25 MG tablet Take 25 mg by mouth daily. 08/16/23  Yes [provider]  Spacer/Aero-Holding Chambers (AEROCHAMBER MV) inhaler Use as instructed 12/10/23  Yes Bernardino Ditch, NP  Vitamin D, Ergocalciferol, (DRISDOL) 50000 UNITS CAPS capsule Take 50,000 Units by mouth every 7 (seven) days.  07/01/14  Yes [provider]  atropine 1 % ophthalmic solution  Place 1 drop into the right eye 2 (two) times daily.  06/21/14   [provider]  Calcium Carbonate (CALCIUM 600 PO) Take 1 tablet by mouth daily.    [provider]  naproxen  (NAPROSYN ) 500 MG tablet Take 1 tablet (500 mg total) by mouth 2 (two) times daily with a meal. 03/04/15   Lacinda Elsie SQUIBB, PA-C    Family History Family History  Problem Relation Age of Onset   Cancer Mother        lung   Stroke Father    Breast cancer Neg Hx     Social History Social History   Tobacco Use   Smoking status: Never   Smokeless tobacco: Never  Vaping Use   Vaping status: Never Used  Substance Use Topics   Alcohol use: No    Alcohol/week: 0.0 standard drinks of alcohol   Drug use: No     Allergies   Patient has no known allergies.   Review of Systems Review of Systems  Constitutional:  Negative for fever.  HENT:  Positive for congestion and rhinorrhea.   Respiratory:  Positive for cough and wheezing. Negative for shortness of breath.      Physical Exam Triage Vital Signs ED Triage Vitals  Encounter Vitals Group     BP      Girls Systolic BP Percentile      Girls Diastolic BP Percentile      Boys Systolic BP Percentile      Boys Diastolic BP Percentile      Pulse      Resp      Temp      Temp src      SpO2      Weight      Height      Head Circumference      Peak Flow      Pain Score      Pain Loc      Pain Education      Exclude from Growth Chart    No data found.  Updated Vital Signs BP 113/74 (BP Location: Right Arm)   Pulse (!) 56   Temp 98.3 F (36.8 C) (Oral)   Resp 16   Ht 5' 4 (1.626 m)   Wt 195 lb 1.7 oz (88.5 kg)   SpO2 93%   BMI 33.49 kg/m   Visual Acuity Right Eye Distance:   Left Eye Distance:   Bilateral Distance:    Right Eye Near:   Left  Eye Near:    Bilateral Near:     Physical Exam Vitals and nursing note reviewed.  Constitutional:      Appearance: Normal appearance. She is not ill-appearing.   Cardiovascular:     Rate and Rhythm: Normal rate and regular rhythm.     Pulses: Normal pulses.     Heart sounds: Normal heart sounds. No murmur heard.    No friction rub. No gallop.  Pulmonary:     Effort: Pulmonary effort is normal.     Breath sounds: Rhonchi present. No wheezing or rales.     Comments: Coarse lung sounds in right lung base. Skin:    General: Skin is warm and dry.     Capillary Refill: Capillary refill takes less than 2 seconds.     Findings: No rash.  Neurological:     General: No focal deficit present.     Mental Status: She is alert and oriented to person, place, and time.      UC Treatments / Results  Labs (all labs ordered are listed, but only abnormal results are displayed) Labs Reviewed - No data to display  EKG   Radiology DG Chest 2 View Result Date: 12/10/2023 CLINICAL DATA:  Productive cough and wheezing x 3 days. EXAM: CHEST - 2 VIEW COMPARISON:  None Available. FINDINGS: The cardiomediastinal silhouette is normal in contour.There is a rounded inferior midline opacity at the level just superior to the diaphragm. No pleural effusion. No pneumothorax. No acute pleuroparenchymal abnormality. Visualized abdomen is unremarkable. Mild multilevel degenerative changes of the thoracic spine. IMPRESSION: 1. There is a rounded inferior midline opacity at the level just superior to the diaphragm. This is nonspecific and could reflect a hiatal hernia. Recommend comparison with any available outside priors to assess for stability. If none are available, nonemergent CT chest could be utilized to confirm etiology of the opacity. 2. Otherwise no acute cardiopulmonary abnormality. Electronically Signed   By: Corean Salter M.D.   On: 12/10/2023 10:45    Procedures Procedures (including critical care time)  Medications Ordered in UC Medications - No data to display  Initial Impression / Assessment and Plan / UC Course  I have reviewed the triage vital signs  and the nursing notes.  Pertinent labs & imaging results that were available during my care of the patient were reviewed by me and considered in my medical decision making (see chart for details).   Patient is a very pleasant, nontoxic-appearing 68 year old female presenting for evaluation of 3 days worth of respiratory symptoms as outlined in the HPI above.  She came in because she had noticed some rattling in the right side of her lungs last night.  She has had an intermittently productive cough as well as wheezing but no shortness of breath.  She does endorse runny nose and nasal congestion.  She was seen by her PCP, Dr. Jeffie, on the 20th and had PCR testing for COVID, influenza, RSV.  Review of the records in epic show that she was negative for those viruses.  She is able to speak in full sentences out dyspnea or tachypnea though she does have coarse lung sounds in the right lung base.  I will obtain a chest x-ray to evaluate for any acute cardiopulmonary pathology.  Chest x-ray independent reviewed and evaluated by me.  Impression: Opacity present in the right lower lobe near the right cardiac border.  Cardiomediastinal silhouette is unremarkable.  Remainder the lung fields are clear.  Radiology overread is pending.  Radiology impression states there is a rounded, inferior midline opacity at the level of just superior to the diaphragm.  This is nonspecific and could reflect a hiatal hernia.  Recommend comparison with any available outside priors to assess for stability.  If none available, nonemergent chest CT should be utilized to confirm etiology of the opacity.  Otherwise, no acute cardiopulmonary abnormality.  There are no other x-rays available for comparison in epic.  Patient does have a report from a portable chest from June 2019 status post pneumothorax but there is no indication of a rounded inferior midline opacity.  The rad is recommending nonemergent chest CT so I will have the patient  follow-up with her PCP.  I will start her on a round of doxycycline  to cover for any potentially infectious etiology as well as Tessalon  Perles, Promethazine  DM cough syrup for cough and congestion, Atrovent  nasal spray for nasal congestion, and an albuterol  inhaler with a spacer to help with shortness of breath and wheezing.   Final Clinical Impressions(s) / UC Diagnoses   Final diagnoses:  Acute cough  Wheezing     Discharge Instructions      Your chest x-ray shows a rounded opacity in the lower center of your chest that is of unclear etiology.  They are recommending a nonemergent CT of your chest to evaluate the density for better clarification.  Contact your primary care provider on Monday to request an appointment or discuss having them order an outpatient CT scan of your chest for better clarification of this rounded density.  To cover for potential infectious sources somata start you on a round of doxycycline .  Please take 100 mg twice daily with food for 7 days.  Use the albuterol  inhaler, with the spacer, and take 1 to 2 puffs every 4-6 hours as needed for any shortness of breath or wheezing.  Use the Atrovent  nasal spray, 2 squirts in each nostril every 6 hours, as needed for runny nose and postnasal drip.  Use the Tessalon  Perles every 8 hours during the day.  Take them with a small sip of water .  They may give you some numbness to the base of your tongue or a metallic taste in your mouth, this is normal.  Use the Promethazine  DM cough syrup at bedtime for cough and congestion.  It will make you drowsy so do not take it during the day.  Return for reevaluation or see your primary care provider for any new or worsening symptoms.      ED Prescriptions     Medication Sig Dispense Auth. Provider   Spacer/Aero-Holding Chambers (AEROCHAMBER MV) inhaler Use as instructed 1 each Bernardino Ditch, NP   albuterol  (VENTOLIN  HFA) 108 (90 Base) MCG/ACT inhaler Inhale 2 puffs into the  lungs every 4 (four) hours as needed. 18 g Bernardino Ditch, NP   benzonatate  (TESSALON ) 100 MG capsule Take 2 capsules (200 mg total) by mouth every 8 (eight) hours. 21 capsule Bernardino Ditch, NP   ipratropium (ATROVENT ) 0.06 % nasal spray Place 2 sprays into both nostrils 4 (four) times daily. 15 mL Bernardino Ditch, NP   promethazine -dextromethorphan (PROMETHAZINE -DM) 6.25-15 MG/5ML syrup Take 5 mLs by mouth 4 (four) times daily as needed. 118 mL Bernardino Ditch, NP   doxycycline  (VIBRAMYCIN ) 100 MG capsule Take 1 capsule (100 mg total) by mouth 2 (two) times daily for 7 days. 14 capsule Bernardino Ditch, NP      PDMP not reviewed this encounter.   Bernardino Ditch, NP 12/10/23 1108

## 2023-12-10 NOTE — Discharge Instructions (Addendum)
 Your chest x-ray shows a rounded opacity in the lower center of your chest that is of unclear etiology.  They are recommending a nonemergent CT of your chest to evaluate the density for better clarification.  Contact your primary care provider on Monday to request an appointment or discuss having them order an outpatient CT scan of your chest for better clarification of this rounded density.  To cover for potential infectious sources somata start you on a round of doxycycline .  Please take 100 mg twice daily with food for 7 days.  Use the albuterol  inhaler, with the spacer, and take 1 to 2 puffs every 4-6 hours as needed for any shortness of breath or wheezing.  Use the Atrovent  nasal spray, 2 squirts in each nostril every 6 hours, as needed for runny nose and postnasal drip.  Use the Tessalon  Perles every 8 hours during the day.  Take them with a small sip of water .  They may give you some numbness to the base of your tongue or a metallic taste in your mouth, this is normal.  Use the Promethazine  DM cough syrup at bedtime for cough and congestion.  It will make you drowsy so do not take it during the day.  Return for reevaluation or see your primary care provider for any new or worsening symptoms.

## 2023-12-10 NOTE — ED Triage Notes (Signed)
 Pt c/o cough, wheezing. Started about 3 days ago. She states the wheezing occurs when she lays down on the right side. She was seen on 12/08/23 at her PCP and tested negative for flu, covid , and RSV
